# Patient Record
Sex: Female | Born: 1975 | Race: Black or African American | Hispanic: No | Marital: Single | Smoking: Never smoker
Health system: Southern US, Community
[De-identification: ages and names within clinical notes are randomized; demographics above are authoritative.]

## PROBLEM LIST (undated history)

## (undated) DIAGNOSIS — J45909 Unspecified asthma, uncomplicated: Secondary | ICD-10-CM

## (undated) DIAGNOSIS — R569 Unspecified convulsions: Secondary | ICD-10-CM

## (undated) HISTORY — PX: EYE SURGERY: SHX253

## (undated) HISTORY — PX: SKIN BIOPSY: SHX1

## (undated) HISTORY — PX: ENDOMETRIAL BIOPSY: SHX622

## (undated) HISTORY — PX: TONSILLECTOMY: SUR1361

---

## 2013-03-29 ENCOUNTER — Other Ambulatory Visit (HOSPITAL_COMMUNITY)
Admission: RE | Admit: 2013-03-29 | Discharge: 2013-03-29 | Disposition: A | Discharge status: Home / Self Care | Payer: BC Managed Care – PPO | Source: Ambulatory Visit | Attending: Obstetrics & Gynecology | Admitting: Obstetrics & Gynecology

## 2013-03-29 DIAGNOSIS — Z01419 Encounter for gynecological examination (general) (routine) without abnormal findings: Secondary | ICD-10-CM | POA: Insufficient documentation

## 2013-03-29 DIAGNOSIS — Z1151 Encounter for screening for human papillomavirus (HPV): Secondary | ICD-10-CM | POA: Insufficient documentation

## 2013-03-29 DIAGNOSIS — Z113 Encounter for screening for infections with a predominantly sexual mode of transmission: Secondary | ICD-10-CM | POA: Insufficient documentation

## 2013-04-16 ENCOUNTER — Emergency Department (HOSPITAL_COMMUNITY): Payer: BC Managed Care – PPO

## 2013-04-16 ENCOUNTER — Emergency Department (HOSPITAL_COMMUNITY)
Admission: EM | Admit: 2013-04-16 | Discharge: 2013-04-16 | Disposition: A | Discharge status: Home / Self Care | Payer: BC Managed Care – PPO | Attending: Emergency Medicine | Admitting: Emergency Medicine

## 2013-04-16 ENCOUNTER — Encounter (HOSPITAL_COMMUNITY): Payer: Self-pay | Admitting: Emergency Medicine

## 2013-04-16 DIAGNOSIS — Z8669 Personal history of other diseases of the nervous system and sense organs: Secondary | ICD-10-CM | POA: Insufficient documentation

## 2013-04-16 DIAGNOSIS — J45901 Unspecified asthma with (acute) exacerbation: Secondary | ICD-10-CM | POA: Insufficient documentation

## 2013-04-16 HISTORY — DX: Unspecified convulsions: R56.9

## 2013-04-16 HISTORY — DX: Unspecified asthma, uncomplicated: J45.909

## 2013-04-16 MED ORDER — PREDNISONE 20 MG PO TABS
60.0000 mg | ORAL_TABLET | Freq: Once | ORAL | Status: AC
  Administered 2013-04-16: 60 mg via ORAL
  Filled 2013-04-16: qty 3

## 2013-04-16 MED ORDER — IPRATROPIUM BROMIDE 0.02 % IN SOLN
0.5000 mg | Freq: Once | RESPIRATORY_TRACT | Status: AC
  Administered 2013-04-16: 0.5 mg via RESPIRATORY_TRACT
  Filled 2013-04-16: qty 2.5

## 2013-04-16 MED ORDER — ALBUTEROL SULFATE HFA 108 (90 BASE) MCG/ACT IN AERS: 2.0000 | INHALATION_SPRAY | RESPIRATORY_TRACT | Status: AC | PRN

## 2013-04-16 MED ORDER — PREDNISONE 10 MG PO TABS: 60.0000 mg | ORAL_TABLET | Freq: Every day | ORAL | Status: AC

## 2013-04-16 MED ORDER — ALBUTEROL SULFATE (5 MG/ML) 0.5% IN NEBU
5.0000 mg | INHALATION_SOLUTION | Freq: Once | RESPIRATORY_TRACT | Status: AC
  Administered 2013-04-16: 5 mg via RESPIRATORY_TRACT
  Filled 2013-04-16: qty 1

## 2013-04-16 NOTE — ED Notes (Signed)
Bed: WA11 Expected date:  Expected time:  Means of arrival:  Comments: EMS 

## 2013-04-16 NOTE — ED Provider Notes (Signed)
CSN: [id number]     Arrival date & time 04/16/13  2206 History   First MD Initiated Contact with Patient 04/16/13 2208     Chief Complaint  Patient presents with  . Shortness of Breath    HPI Patient states developing some shortness of breath today with some tightness in her upper back.  She has a history of asthma.  She states this feels similar.  She's had productive coughing congestion.  She had some shortness of breath when she is walking up her stairs this evening which is what prompted her to call EMS.  She states initially she thought this could be an allergic reaction because she was having some itching of her bilateral antecubital fossa scar that seems to resolve.  No hives noted by EMS.   Past Medical History  Diagnosis Date  . Asthma   . Seizures    Past Surgical History  Procedure Laterality Date  . Tonsillectomy    . Eye surgery    . Endometrial biopsy    . Skin biopsy     History reviewed. No pertinent family history. History  Substance Use Topics  . Smoking status: Never Smoker   . Smokeless tobacco: Never Used  . Alcohol Use: No   OB History   Grav Para Term Preterm Abortions TAB SAB Ect Mult Living                 Review of Systems  All other systems reviewed and are negative.    Allergies  Dilantin  Home Medications  No current outpatient prescriptions on file. BP 148/95  Pulse 85  Temp(Src) 98.6 F (37 C) (Oral)  Resp 20  Ht 5\' 2"  (1.575 m)  Wt 230 lb (104.327 kg)  BMI 42.06 kg/m2  SpO2 100% Physical Exam  Nursing note and vitals reviewed. Constitutional: She is oriented to person, place, and time. She appears well-developed and well-nourished. No distress.  HENT:  Head: Normocephalic and atraumatic.  Eyes: EOM are normal.  Neck: Normal range of motion.  Cardiovascular: Normal rate, regular rhythm and normal heart sounds.   Pulmonary/Chest: Effort normal. She has wheezes.  Abdominal: Soft. She exhibits no distension. There is no  tenderness.  Musculoskeletal: Normal range of motion.  Neurological: She is alert and oriented to person, place, and time.  Skin: Skin is warm and dry.  Psychiatric: She has a normal mood and affect. Judgment normal.    ED Course  Procedures (including critical care time) Labs Review Labs Reviewed - No data to display Imaging Review Dg Chest 2 View  04/16/2013   CLINICAL DATA:  Short of breath.  EXAM: CHEST  2 VIEW  COMPARISON:  None.  FINDINGS: Cardiopericardial silhouette within normal limits. Mediastinal contours normal. Trachea midline. No airspace disease or effusion.  IMPRESSION: No active cardiopulmonary disease.   Electronically Signed   By: Andreas Newport M.D.   On: 04/16/2013 23:14    EKG Interpretation   None       MDM  No diagnosis found. This appears to be asthma exacerbation/bronchospasm.  She feels much better after Atrovent and albuterol.  Prednisone given.  Chest x-ray clear.  Vital signs normal.  No indication for additional testing.  The patient is PERC negative.     Lyanne Co, MD 04/16/13 [phone]

## 2013-04-16 NOTE — ED Notes (Signed)
Brought in by EMS from home with c/o shortness of breath while at rest.  Per EMS, pt reported that she has sudden onset of shortness of breath while sitting in a couch--- pt reports that she just has had dinner, she thought it was an "allergic reaction"; pt reports taking her inhaler but with little relief; pt reports productive cough x 4 weeks now--- states,she has "greenish phlegm"; pt also c/o "shortness of breath with little exertion like walking up the stairs".

## 2014-07-01 ENCOUNTER — Other Ambulatory Visit: Payer: Self-pay

## 2014-07-01 DIAGNOSIS — N649 Disorder of breast, unspecified: Secondary | ICD-10-CM

## 2014-07-04 ENCOUNTER — Other Ambulatory Visit: Payer: Self-pay | Admitting: Nurse Practitioner

## 2014-07-04 ENCOUNTER — Other Ambulatory Visit: Payer: Self-pay

## 2014-07-04 DIAGNOSIS — N649 Disorder of breast, unspecified: Secondary | ICD-10-CM

## 2014-07-04 DIAGNOSIS — N6310 Unspecified lump in the right breast, unspecified quadrant: Secondary | ICD-10-CM

## 2014-07-10 ENCOUNTER — Ambulatory Visit
Admission: RE | Admit: 2014-07-10 | Discharge: 2014-07-10 | Disposition: A | Discharge status: Home / Self Care | Payer: BLUE CROSS/BLUE SHIELD | Source: Ambulatory Visit | Attending: Nurse Practitioner | Admitting: Nurse Practitioner

## 2014-07-10 DIAGNOSIS — N6310 Unspecified lump in the right breast, unspecified quadrant: Secondary | ICD-10-CM

## 2014-09-09 ENCOUNTER — Ambulatory Visit (HOSPITAL_COMMUNITY): Payer: BLUE CROSS/BLUE SHIELD | Admitting: Psychiatry

## 2014-10-23 ENCOUNTER — Ambulatory Visit (HOSPITAL_COMMUNITY): Payer: BLUE CROSS/BLUE SHIELD | Admitting: Psychiatry

## 2014-12-03 ENCOUNTER — Ambulatory Visit (HOSPITAL_COMMUNITY): Payer: BLUE CROSS/BLUE SHIELD | Admitting: Psychiatry

## 2015-05-28 ENCOUNTER — Other Ambulatory Visit (HOSPITAL_COMMUNITY)
Admission: RE | Admit: 2015-05-28 | Discharge: 2015-05-28 | Disposition: A | Discharge status: Home / Self Care | Payer: BLUE CROSS/BLUE SHIELD | Source: Ambulatory Visit | Attending: Obstetrics & Gynecology | Admitting: Obstetrics & Gynecology

## 2015-05-28 ENCOUNTER — Other Ambulatory Visit: Payer: Self-pay | Admitting: Obstetrics & Gynecology

## 2015-05-28 DIAGNOSIS — Z1151 Encounter for screening for human papillomavirus (HPV): Secondary | ICD-10-CM | POA: Insufficient documentation

## 2015-05-28 DIAGNOSIS — Z01419 Encounter for gynecological examination (general) (routine) without abnormal findings: Secondary | ICD-10-CM | POA: Diagnosis present

## 2015-05-29 LAB — CYTOLOGY - PAP

## 2015-06-17 ENCOUNTER — Other Ambulatory Visit: Payer: Self-pay

## 2015-06-17 DIAGNOSIS — Z1231 Encounter for screening mammogram for malignant neoplasm of breast: Secondary | ICD-10-CM

## 2015-07-16 ENCOUNTER — Ambulatory Visit: Payer: Self-pay

## 2015-07-30 ENCOUNTER — Ambulatory Visit
Admission: RE | Admit: 2015-07-30 | Discharge: 2015-07-30 | Disposition: A | Discharge status: Home / Self Care | Payer: BLUE CROSS/BLUE SHIELD | Source: Ambulatory Visit

## 2015-07-30 DIAGNOSIS — Z1231 Encounter for screening mammogram for malignant neoplasm of breast: Secondary | ICD-10-CM

## 2016-05-28 ENCOUNTER — Emergency Department (HOSPITAL_COMMUNITY): Payer: BLUE CROSS/BLUE SHIELD

## 2016-05-28 ENCOUNTER — Emergency Department (HOSPITAL_COMMUNITY)
Admission: EM | Admit: 2016-05-28 | Discharge: 2016-05-28 | Disposition: A | Discharge status: Home / Self Care | Payer: BLUE CROSS/BLUE SHIELD | Attending: Emergency Medicine | Admitting: Emergency Medicine

## 2016-05-28 ENCOUNTER — Encounter (HOSPITAL_COMMUNITY): Payer: Self-pay

## 2016-05-28 DIAGNOSIS — Z79899 Other long term (current) drug therapy: Secondary | ICD-10-CM | POA: Diagnosis not present

## 2016-05-28 DIAGNOSIS — N201 Calculus of ureter: Secondary | ICD-10-CM | POA: Insufficient documentation

## 2016-05-28 DIAGNOSIS — J45909 Unspecified asthma, uncomplicated: Secondary | ICD-10-CM | POA: Insufficient documentation

## 2016-05-28 DIAGNOSIS — R1031 Right lower quadrant pain: Secondary | ICD-10-CM | POA: Diagnosis present

## 2016-05-28 LAB — COMPREHENSIVE METABOLIC PANEL
ALT: 16 U/L (ref 14–54)
ANION GAP: 9 (ref 5–15)
AST: 20 U/L (ref 15–41)
Albumin: 4 g/dL (ref 3.5–5.0)
Alkaline Phosphatase: 67 U/L (ref 38–126)
BILIRUBIN TOTAL: 0.4 mg/dL (ref 0.3–1.2)
BUN: 8 mg/dL (ref 6–20)
CHLORIDE: 103 mmol/L (ref 101–111)
CO2: 26 mmol/L (ref 22–32)
Calcium: 9.2 mg/dL (ref 8.9–10.3)
Creatinine, Ser: 0.78 mg/dL (ref 0.44–1.00)
GFR calc Af Amer: >60 mL/min (ref >60)
GFR calc non Af Amer: >60 mL/min (ref >60)
Glucose, Bld: 126 mg/dL — ABNORMAL HIGH (ref 65–99)
POTASSIUM: 3.5 mmol/L (ref 3.5–5.1)
SODIUM: 138 mmol/L (ref 135–145)
Total Protein: 7.8 g/dL (ref 6.5–8.1)

## 2016-05-28 LAB — URINALYSIS, ROUTINE W REFLEX MICROSCOPIC
Bilirubin Urine: NEGATIVE
Glucose, UA: NEGATIVE mg/dL
KETONES UR: NEGATIVE mg/dL
LEUKOCYTES UA: NEGATIVE
Nitrite: NEGATIVE
Protein, ur: NEGATIVE mg/dL
SPECIFIC GRAVITY, URINE: 1.015 (ref 1.005–1.030)
WBC UA: NONE SEEN WBC/hpf (ref 0–5)
pH: 8 (ref 5.0–8.0)

## 2016-05-28 LAB — PREGNANCY, URINE: PREG TEST UR: NEGATIVE

## 2016-05-28 LAB — CBC WITH DIFFERENTIAL/PLATELET
Basophils Absolute: 0 10*3/uL (ref 0.0–0.1)
Basophils Relative: 0 %
Eosinophils Absolute: 0.1 10*3/uL (ref 0.0–0.7)
Eosinophils Relative: 1 %
HEMATOCRIT: 40.1 % (ref 36.0–46.0)
Hemoglobin: 13.8 g/dL (ref 12.0–15.0)
LYMPHS ABS: 2.4 10*3/uL (ref 0.7–4.0)
LYMPHS PCT: 15 %
MCH: 27.2 pg (ref 26.0–34.0)
MCHC: 34.4 g/dL (ref 30.0–36.0)
MCV: 79.1 fL (ref 78.0–100.0)
Monocytes Absolute: 0.8 10*3/uL (ref 0.1–1.0)
Monocytes Relative: 5 %
Neutro Abs: 12.3 10*3/uL — ABNORMAL HIGH (ref 1.7–7.7)
Neutrophils Relative %: 79 %
Platelets: 348 10*3/uL (ref 150–400)
RBC: 5.07 MIL/uL (ref 3.87–5.11)
RDW: 14.1 % (ref 11.5–15.5)
WBC: 15.5 10*3/uL — ABNORMAL HIGH (ref 4.0–10.5)

## 2016-05-28 LAB — LIPASE, BLOOD: Lipase: 23 U/L (ref 11–51)

## 2016-05-28 MED ORDER — OXYCODONE-ACETAMINOPHEN 5-325 MG PO TABS: 1.0000 | ORAL_TABLET | ORAL | 0 refills | Status: AC | PRN

## 2016-05-28 MED ORDER — ONDANSETRON HCL 4 MG/2ML IJ SOLN
4.0000 mg | Freq: Once | INTRAMUSCULAR | Status: AC
  Administered 2016-05-28: 4 mg via INTRAVENOUS
  Filled 2016-05-28: qty 2

## 2016-05-28 MED ORDER — FENTANYL CITRATE (PF) 100 MCG/2ML IJ SOLN
50.0000 ug | Freq: Once | INTRAMUSCULAR | Status: AC
  Administered 2016-05-28: 50 ug via INTRAVENOUS
  Filled 2016-05-28: qty 2

## 2016-05-28 MED ORDER — ONDANSETRON 4 MG PO TBDP: 4.0000 mg | ORAL_TABLET | Freq: Three times a day (TID) | ORAL | 0 refills | Status: AC | PRN

## 2016-05-28 MED ORDER — TAMSULOSIN HCL 0.4 MG PO CAPS: ORAL_CAPSULE | ORAL | 0 refills | Status: AC

## 2016-05-28 MED ORDER — KETOROLAC TROMETHAMINE 10 MG PO TABS: 10.0000 mg | ORAL_TABLET | Freq: Four times a day (QID) | ORAL | 0 refills | Status: AC | PRN

## 2016-05-28 MED ORDER — IOPAMIDOL (ISOVUE-300) INJECTION 61%
100.0000 mL | Freq: Once | INTRAVENOUS | Status: AC | PRN
  Administered 2016-05-28: 100 mL via INTRAVENOUS

## 2016-05-28 MED ORDER — KETOROLAC TROMETHAMINE 30 MG/ML IJ SOLN
30.0000 mg | Freq: Once | INTRAMUSCULAR | Status: AC
  Administered 2016-05-28: 30 mg via INTRAVENOUS
  Filled 2016-05-28: qty 1

## 2016-05-28 NOTE — ED Provider Notes (Signed)
[facility]-EMER[facility]ENCY DEPT Provider Note   CSN: [id number] Arrival date & time: 05/28/16  0046  Time seen 01:15 AM   History   Chief Complaint Chief Complaint  Patient presents with  . Abdominal Pain    HPI Megan Palmer is a 41 y.o. female.  HPI  patient reports she had felt fine all day. She states about 3 hours ago she had acute onset of right lower quadrant pain that she describes as sharp and constant. She states it hurts with movement however patient has difficulty holding still. She states nothing she does makes it feel better. She did take Aleve without improvement. She states the pain does not radiate. She has had nausea and vomited once. She denies diarrhea, constipation, dysuria, frequency, hematuria. However she states she feels the urge to urinate or have a bowel movement and she can't go. She states she's never had this pain before. She has no prior abdominal surgeries.  PCP REDMON,NOELLE, PA-C   Past Medical History:  Diagnosis Date  . Asthma   . Seizures (HCC)     There are no active problems to display for this patient.   Past Surgical History:  Procedure Laterality Date  . ENDOMETRIAL BIOPSY    . EYE SURGERY    . SKIN BIOPSY    . TONSILLECTOMY      OB History    No data available       Home Medications    Prior to Admission medications   Medication Sig Start Date End Date Taking? Authorizing Provider  albuterol (PROVENTIL HFA;VENTOLIN HFA) 108 (90 BASE) MCG/ACT inhaler Inhale 2 puffs into the lungs every 6 (six) hours as needed for wheezing or shortness of breath (SOB).   Yes Historical Provider, MD  albuterol (PROVENTIL HFA;VENTOLIN HFA) 108 (90 BASE) MCG/ACT inhaler Inhale 2 puffs into the lungs every 4 (four) hours as needed for wheezing or shortness of breath. 04/16/13  Yes Azalia Bilis, MD  busPIRone (BUSPAR) 10 MG tablet Take 10 mg by mouth 3 (three) times daily.   Yes Historical Provider, MD  cetirizine (ZYRTEC) 10 MG tablet Take 10 mg by mouth  daily.   Yes Historical Provider, MD  fluticasone (FLOVENT HFA) 220 MCG/ACT inhaler Inhale 2 puffs into the lungs 2 (two) times daily.   Yes Historical Provider, MD  Multiple Vitamins-Minerals (CENTRUM PO) Take 1 tablet by mouth daily.   Yes Historical Provider, MD  omeprazole (PRILOSEC) 20 MG capsule Take 20 mg by mouth daily.   Yes Historical Provider, MD  sertraline (ZOLOFT) 100 MG tablet Take 150 mg by mouth daily. Takes one and one half tablets to =150mg    Yes Historical Provider, MD  traZODone (DESYREL) 50 MG tablet Take 50 mg by mouth at bedtime.   Yes Historical Provider, MD  ketorolac (TORADOL) 10 MG tablet Take 1 tablet (10 mg total) by mouth every 6 (six) hours as needed. 05/28/16   Devoria Albe, MD  ondansetron (ZOFRAN ODT) 4 MG disintegrating tablet Take 1 tablet (4 mg total) by mouth every 8 (eight) hours as needed for nausea or vomiting. 05/28/16   Devoria Albe, MD  oxyCODONE-acetaminophen (PERCOCET/ROXICET) 5-325 MG tablet Take 1 tablet by mouth every 4 (four) hours as needed for severe pain. 05/28/16   Devoria Albe, MD  predniSONE (DELTASONE) 10 MG tablet Take 6 tablets (60 mg total) by mouth daily. 04/16/13   Azalia Bilis, MD  tamsulosin (FLOMAX) 0.4 MG CAPS capsule Take 1 po QD until you pass the stone. 05/28/16  Devoria AlbeIva Ethin Drummond, MD    Family History No family history on file.  Social History Social History  Substance Use Topics  . Smoking status: Never Smoker  . Smokeless tobacco: Never Used  . 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    Physical Exam Updated Vital Signs BP 154/98 (BP Location: Left Arm)   Pulse 89   Temp 98.4 F (36.9 C) (Oral)   Resp 20   Ht 5\' 1"  (1.549 m)   Wt 228 lb (103.4 kg)   SpO2 100%   BMI 43.08 kg/m   Vital signs normal except for hypertension   Physical Exam  Constitutional: She is oriented to person, place,  and time. She appears well-developed and well-nourished.  Non-toxic appearance. She does not appear ill. She appears distressed.  HENT:  Head: Normocephalic and atraumatic.  Right Ear: External ear normal.  Left Ear: External ear normal.  Nose: Nose normal. No mucosal edema or rhinorrhea.  Mouth/Throat: Oropharynx is clear and moist and mucous membranes are normal. No dental abscesses or uvula swelling.  Eyes: Conjunctivae and EOM are normal. Pupils are equal, round, and reactive to light.  Eyes mildly disconjugate (left deviate laterally)  Neck: Normal range of motion and full passive range of motion without pain. Neck supple.  Cardiovascular: Normal rate, regular rhythm and normal heart sounds.  Exam reveals no gallop and no friction rub.   No murmur heard. Pulmonary/Chest: Effort normal and breath sounds normal. No respiratory distress. She has no wheezes. She has no rhonchi. She has no rales. She exhibits no tenderness and no crepitus.  Abdominal: Soft. Normal appearance and bowel sounds are normal. She exhibits no distension. There is tenderness. There is no rebound and no guarding.    Musculoskeletal: Normal range of motion. She exhibits no edema or tenderness.  Moves all extremities well.   Neurological: She is alert and oriented to person, place, and time. She has normal strength. No cranial nerve deficit.  Skin: Skin is warm, dry and intact. No rash noted. No erythema. No pallor.  Psychiatric: She has a normal mood and affect. Her speech is normal and behavior is normal. Her mood appears not anxious.  Nursing note and vitals reviewed.    ED Treatments / Results  Labs (all labs ordered are listed, but only abnormal results are displayed) Results for orders placed or performed during the hospital encounter of 05/28/16  Comprehensive metabolic panel  Result Value Ref Range   Sodium 138 135 - 145 mmol/L   Potassium 3.5 3.5 - 5.1 mmol/L   Chloride 103 101 - 111 mmol/L   CO2 26  22 - 32 mmol/L   Glucose, Bld 126 (H) 65 - 99 mg/dL   BUN 8 6 - 20 mg/dL   Creatinine, Ser 1.610.78 0.44 - 1.00 mg/dL   Calcium 9.2 8.9 - 09.610.3 mg/dL   Total Protein 7.8 6.5 - 8.1 g/dL   Albumin 4.0 3.5 - 5.0 g/dL   AST 20 15 - 41 U/L   ALT 16 14 - 54 U/L   Alkaline Phosphatase 67 38 - 126 U/L   Total Bilirubin 0.4 0.3 - 1.2 mg/dL   GFR calc non Af Amer >60 >60 mL/min   GFR calc Af Amer >60 >60 mL/min   Anion gap 9 5 - 15  CBC with Differential  Result Value Ref  Range   WBC 15.5 (H) 4.0 - 10.5 K/uL   RBC 5.07 3.87 - 5.11 MIL/uL   Hemoglobin 13.8 12.0 - 15.0 g/dL   HCT 16.1 09.6 - 04.5 %   MCV 79.1 78.0 - 100.0 fL   MCH 27.2 26.0 - 34.0 pg   MCHC 34.4 30.0 - 36.0 g/dL   RDW 40.9 81.1 - 91.4 %   Platelets 348 150 - 400 K/uL   Neutrophils Relative % 79 %   Neutro Abs 12.3 (H) 1.7 - 7.7 K/uL   Lymphocytes Relative 15 %   Lymphs Abs 2.4 0.7 - 4.0 K/uL   Monocytes Relative 5 %   Monocytes Absolute 0.8 0.1 - 1.0 K/uL   Eosinophils Relative 1 %   Eosinophils Absolute 0.1 0.0 - 0.7 K/uL   Basophils Relative 0 %   Basophils Absolute 0.0 0.0 - 0.1 K/uL  Lipase, blood  Result Value Ref Range   Lipase 23 11 - 51 U/L  Urinalysis, Routine w reflex microscopic  Result Value Ref Range   Color, Urine YELLOW YELLOW   APPearance CLEAR CLEAR   Specific Gravity, Urine 1.015 1.005 - 1.030   pH 8.0 5.0 - 8.0   Glucose, UA NEGATIVE NEGATIVE mg/dL   Hgb urine dipstick SMALL (A) NEGATIVE   Bilirubin Urine NEGATIVE NEGATIVE   Ketones, ur NEGATIVE NEGATIVE mg/dL   Protein, ur NEGATIVE NEGATIVE mg/dL   Nitrite NEGATIVE NEGATIVE   Leukocytes, UA NEGATIVE NEGATIVE   RBC / HPF 0-5 0 - 5 RBC/hpf   WBC, UA NONE SEEN 0 - 5 WBC/hpf   Bacteria, UA RARE (A) NONE SEEN   Mucous PRESENT   Pregnancy, urine  Result Value Ref Range   Preg Test, Ur NEGATIVE NEGATIVE   Laboratory interpretation all normal except leukocytosis, microscopic hematuria    EKG  EKG Interpretation None       Radiology Ct  Abdomen Pelvis W Contrast  Result Date: 05/28/2016 CLINICAL DATA:  Right lower quadrant abdominal pressure for 2 days with vomiting. EXAM: CT ABDOMEN AND PELVIS WITH CONTRAST TECHNIQUE: Multidetector CT imaging of the abdomen and pelvis was performed using the standard protocol following bolus administration of intravenous contrast. CONTRAST:  ISOVUE-300 IOPAMIDOL (ISOVUE-300) INJECTION 61% COMPARISON:  Unenhanced CT obtained earlier today. FINDINGS: Lower chest: No acute abnormality. Hepatobiliary: No focal liver abnormality is seen. No gallstones, gallbladder wall thickening, or biliary dilatation. Pancreas: Unremarkable. No pancreatic ductal dilatation or surrounding inflammatory changes. Spleen: Normal in size without focal abnormality. Adrenals/Urinary Tract: There is marked hydronephrosis and ureteral dilatation on the right, with perinephric and periureteral stranding as well as perinephric effusion. There is an obstructing 3 mm calculus in the distal right ureter within 1 cm of the ureterovesical junction. Delayed function of the right kidney. The left kidney is normal. Collecting system and ureter are normal on the left. Urinary bladder is unremarkable. Stomach/Bowel: Stomach, small bowel and colon are unremarkable. Vascular/Lymphatic: No significant vascular findings are present. No enlarged abdominal or pelvic lymph nodes. Reproductive: Markedly enlarged multi fibroid uterus. Largest fibroid is in the fundus, measuring over 12 cm. Other: No ascites. Musculoskeletal: No significant skeletal lesion. IMPRESSION: 1. Obstructing 3 mm calculus of the distal right ureter, 1 cm from the UVJ. Marked hydronephrosis and hydroureter. Perinephric fluid on the right. 2. Markedly enlarged multi fibroid uterus. Largest individual fibroid is over 12 cm. Electronically Signed   By: Ellery Plunk M.D.   On: 05/28/2016 03:48   Ct Renal Stone Study  Result Date:  05/28/2016 CLINICAL DATA:  RIGHT lower quadrant  pressure for 2 days, vomiting. Pelvic pressure. EXAM: CT ABDOMEN AND PELVIS WITHOUT CONTRAST TECHNIQUE: Multidetector CT imaging of the abdomen and pelvis was performed following the standard protocol without IV contrast. COMPARISON:  None. FINDINGS: LOWER CHEST: Lung bases are clear. The visualized heart size is normal. No pericardial effusion. HEPATOBILIARY: Normal. PANCREAS: Normal. SPLEEN: Normal. ADRENALS/URINARY TRACT: Kidneys are orthotopic RIGHT kidney appears mildly enlarged, diffusely hypodense/edematous with perinephric effusion. Mild RIGHT hydronephrosis. Limited assessment for renal masses on this nonenhanced examination. The unopacified ureters are normal in course and caliber. Urinary bladder is decompressed and unremarkable. Normal adrenal glands. STOMACH/BOWEL: The stomach, small and large bowel are normal in course and caliber without inflammatory changes, sensitivity decreased by lack of enteric contrast. The appendix is not discretely identified, however there are no inflammatory changes in the right lower quadrant. VASCULAR/LYMPHATIC: Aortoiliac vessels are normal in course and caliber. No lymphadenopathy by CT size criteria. REPRODUCTIVE: Enlarged uterus with multiple leiomyomata, largest in the uterine fundus measuring 12.6 x 10.7 x 11.2 cm. OTHER: No intraperitoneal free fluid or free air. MUSCULOSKELETAL: Non-acute. IMPRESSION: Markedly edematous RIGHT kidney with effusion, differential diagnosis is pyelonephritis, vascular compromise less likely (limited characterization without contrast). Mild RIGHT hydro nephrosis without urolithiasis. Enlarged leiomyomatosis uterus. Electronically Signed   By: Awilda Metro M.D.   On: 05/28/2016 02:37    Procedures Procedures (including critical care time)  Medications Ordered in ED Medications  fentaNYL (SUBLIMAZE) injection 50 mcg (50 mcg Intravenous Given 05/28/16 0154)  ondansetron (ZOFRAN) injection 4 mg (4 mg Intravenous Given 05/28/16  0154)  iopamidol (ISOVUE-300) 61 % injection 100 mL (100 mLs Intravenous Contrast Given 05/28/16 0325)  ketorolac (TORADOL) 30 MG/ML injection 30 mg (30 mg Intravenous Given 05/28/16 0402)     Initial Impression / Assessment and Plan / ED Course  I have reviewed the triage vital signs and the nursing notes.  Pertinent labs & imaging results that were available during my care of the patient were reviewed by me and considered in my medical decision making (see chart for details).    Patient was given IV pain and nausea medication. CT renal was done for suspected ureteral stone.  Recheck at 2:45 AM we discussed her test results. Patient is aware of her fibroids. Her bladder scan only showed 41 mL urine. We discussed her CT results and she is agreeable to proceeding with the IV contrast to look for vascular compromise of her right kidney. 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 She has refused more pain medication at this time, advised to let nurse know if she changes her mind.   03:55 AM we discussed her contrasted CT results. She was given IV toradol for pain.   At time of discharge her pain was improved. She wants to be referred to Urology in [place]. She was given a strainer to use.   Final Clinical Impressions(s) / ED Diagnoses   Final diagnoses:  Ureteral calculus, right    New Prescriptions New Prescriptions   KETOROLAC (TORADOL) 10 MG TABLET    Take 1 tablet (10 mg total) by mouth every 6 (six) hours as needed.   ONDANSETRON (ZOFRAN ODT) 4 MG DISINTEGRATING TABLET    Take 1 tablet (4 mg total) by mouth every 8 (eight) hours as needed for nausea or vomiting.   OXYCODONE-ACETAMINOPHEN (PERCOCET/ROXICET) 5-325 MG TABLET  Take 1 tablet by mouth every 4 (four) hours as needed for severe pain.   TAMSULOSIN (FLOMAX) 0.4 MG CAPS CAPSULE    Take 1 po QD until you pass the stone.    Plan discharge  Devoria Albe, MD, Concha Pyo, MD 05/28/16 [phone]

## 2016-05-28 NOTE — ED Notes (Signed)
Patient transported to CT 

## 2016-05-28 NOTE — Discharge Instructions (Signed)
Drink plenty of fluids. Take the medications as prescribed. Strain your urine and save the stone when passed, it can be sent off for analysis to see what type of stone you have. Follow up with Dr Mena GoesEskridge, Urology. You have a 3 mm stone in the right UVJ. Return to the ED for fever, uncontrolled vomiting or pain.

## 2016-05-28 NOTE — ED Triage Notes (Signed)
RLQ abd pressure x 2 days with vomiting, no diarrhea.  Pt states she also feels like she has to urinate and also feels like she has to have a bm but nothing happens.

## 2016-07-27 ENCOUNTER — Other Ambulatory Visit: Payer: Self-pay | Admitting: Physician Assistant

## 2016-07-27 DIAGNOSIS — Z1231 Encounter for screening mammogram for malignant neoplasm of breast: Secondary | ICD-10-CM

## 2016-08-19 ENCOUNTER — Ambulatory Visit
Admission: RE | Admit: 2016-08-19 | Discharge: 2016-08-19 | Disposition: A | Discharge status: Home / Self Care | Payer: BLUE CROSS/BLUE SHIELD | Source: Ambulatory Visit | Attending: Physician Assistant | Admitting: Physician Assistant

## 2016-08-19 DIAGNOSIS — Z1231 Encounter for screening mammogram for malignant neoplasm of breast: Secondary | ICD-10-CM

## 2017-08-05 ENCOUNTER — Other Ambulatory Visit: Payer: Self-pay | Admitting: Physician Assistant

## 2017-08-05 DIAGNOSIS — Z139 Encounter for screening, unspecified: Secondary | ICD-10-CM

## 2017-09-05 ENCOUNTER — Ambulatory Visit
Admission: RE | Admit: 2017-09-05 | Discharge: 2017-09-05 | Disposition: A | Discharge status: Home / Self Care | Payer: BLUE CROSS/BLUE SHIELD | Source: Ambulatory Visit | Attending: Physician Assistant | Admitting: Physician Assistant

## 2017-09-05 DIAGNOSIS — Z139 Encounter for screening, unspecified: Secondary | ICD-10-CM

## 2018-07-25 ENCOUNTER — Encounter: Payer: Self-pay | Admitting: Registered"

## 2018-07-25 ENCOUNTER — Encounter: Payer: 59 | Attending: Physician Assistant | Admitting: Registered"

## 2018-07-25 ENCOUNTER — Other Ambulatory Visit: Payer: Self-pay

## 2018-07-25 DIAGNOSIS — E669 Obesity, unspecified: Secondary | ICD-10-CM

## 2018-07-25 NOTE — Progress Notes (Signed)
  Medical Nutrition Therapy:  Appt start time: 3:55 end time:  4:55.   Assessment:  Primary concerns today: Pt arrives stating she wants to learn how to read read labels.   Pt expectations: wants to learn how to fight the temptations of eating out, reading labels and how to understand it  Pt states she has a 44 yr old daughter and wants to model for her how to eat. Pt states she eats half a meal when eating out and saves other half for another time. Pt states she likes to have ice cream before going to bed and will eat that as dinner. Pt states she wants to know how much nuts she can have in a day due to fat content.   Pt states she typically does not eat dinner with daughter, but will clean and organize in preparation for the next day. Pt states she cooks on the weekend. Will prepare enough entree for the week and change out the sides throughout the week. Will have one day in the middle of the week for eating out.   Preferred Learning Style:   No preference indicated   Learning Readiness:   Ready  Change in progress   MEDICATIONS: See list   DIETARY INTAKE:  Usual eating pattern includes 2 meals and 0-2 snacks per day.  Everyday foods include ice cream, fast food, oatmeal.  Avoided foods include none stated.    24-hr recall:  B ( AM): low-sugar oatmeal + handful mixed nuts + mandarin orange Snk ( AM): none  L ( PM): Mexican-3 chicken taquitos + salad + guacamole Snk ( PM): sometimes granola bar D ( PM): 1/3 c ice cream  Snk ( PM): sometimes snack bag of miniature oreo's in the middle of the night Beverages: water, coffee, soda (once a week)  Usual physical activity: none stated   Estimated energy needs: 1800 calories 200 g carbohydrates 135 g protein 50 g fat  Progress Towards Goal(s):  In progress.   Nutritional Diagnosis:  NB-1.1 Food and nutrition-related knowledge deficit As related to reading nutrition facts label.  As evidenced by pt verbalizes incomplete  knowledge.    Intervention:  Nutrition education and counseling. Pt was educated and counseled on nourishing body during the day, importance of having balanced meals, importance of having meals together as family daily, and the reading nutrition facts label. Pt was in agreement with goals listed.  Goals: - Aim to have dinner with daughter nightly.  - Have meals include: protein, non-starchy vegetables, carbohydrates.  - Snacks between meals include carbohydrates + protein such as fruit + nuts/nut butter - Increase fiber intake with whole grains, fruits/vegetables, beans, and nuts.   Teaching Method Utilized:  Visual Auditory Hands on  Handouts given during visit include:  What's on the Nutrition Facts Label?  Barriers to learning/adherence to lifestyle change: none identified  Demonstrated degree of understanding via:  Teach Back   Monitoring/Evaluation:  Dietary intake, exercise, and body weight in 1 month(s).

## 2018-07-25 NOTE — Patient Instructions (Addendum)
-   Aim to have dinner with daughter nightly.   - Have meals include: protein, non-starchy vegetables, carbohydrates.   - Snacks between meals include carbohydrates + protein such as fruit + nuts/nut butter  - Increase fiber intake with whole grains, fruits/vegetables, beans, and nuts.

## 2018-08-29 ENCOUNTER — Ambulatory Visit: Payer: Self-pay | Admitting: Registered"

## 2018-09-04 ENCOUNTER — Other Ambulatory Visit: Payer: Self-pay | Admitting: Obstetrics & Gynecology

## 2018-09-04 DIAGNOSIS — Z1231 Encounter for screening mammogram for malignant neoplasm of breast: Secondary | ICD-10-CM

## 2018-09-28 ENCOUNTER — Ambulatory Visit: Payer: 59

## 2018-09-28 ENCOUNTER — Other Ambulatory Visit: Payer: Self-pay

## 2018-09-28 ENCOUNTER — Ambulatory Visit
Admission: RE | Admit: 2018-09-28 | Discharge: 2018-09-28 | Disposition: A | Discharge status: Home / Self Care | Payer: 59 | Source: Ambulatory Visit | Attending: Obstetrics & Gynecology | Admitting: Obstetrics & Gynecology

## 2018-09-28 DIAGNOSIS — Z1231 Encounter for screening mammogram for malignant neoplasm of breast: Secondary | ICD-10-CM

## 2019-09-11 ENCOUNTER — Other Ambulatory Visit: Payer: Self-pay | Admitting: Obstetrics & Gynecology

## 2019-09-11 DIAGNOSIS — Z1231 Encounter for screening mammogram for malignant neoplasm of breast: Secondary | ICD-10-CM

## 2019-10-03 ENCOUNTER — Other Ambulatory Visit: Payer: Self-pay

## 2019-10-03 ENCOUNTER — Ambulatory Visit
Admission: RE | Admit: 2019-10-03 | Discharge: 2019-10-03 | Disposition: A | Discharge status: Home / Self Care | Payer: 59 | Source: Ambulatory Visit | Attending: Obstetrics & Gynecology | Admitting: Obstetrics & Gynecology

## 2019-10-03 DIAGNOSIS — Z1231 Encounter for screening mammogram for malignant neoplasm of breast: Secondary | ICD-10-CM

## 2020-05-13 ENCOUNTER — Ambulatory Visit (INDEPENDENT_AMBULATORY_CARE_PROVIDER_SITE_OTHER): Payer: 59

## 2020-05-13 ENCOUNTER — Other Ambulatory Visit: Payer: Self-pay

## 2020-05-13 ENCOUNTER — Ambulatory Visit: Payer: 59 | Admitting: Podiatry

## 2020-05-13 DIAGNOSIS — M722 Plantar fascial fibromatosis: Secondary | ICD-10-CM

## 2020-05-13 DIAGNOSIS — J45909 Unspecified asthma, uncomplicated: Secondary | ICD-10-CM | POA: Insufficient documentation

## 2020-05-13 DIAGNOSIS — J309 Allergic rhinitis, unspecified: Secondary | ICD-10-CM | POA: Insufficient documentation

## 2020-05-13 DIAGNOSIS — K219 Gastro-esophageal reflux disease without esophagitis: Secondary | ICD-10-CM | POA: Insufficient documentation

## 2020-05-13 DIAGNOSIS — F339 Major depressive disorder, recurrent, unspecified: Secondary | ICD-10-CM | POA: Insufficient documentation

## 2020-05-13 DIAGNOSIS — E559 Vitamin D deficiency, unspecified: Secondary | ICD-10-CM | POA: Insufficient documentation

## 2020-05-13 DIAGNOSIS — M216X9 Other acquired deformities of unspecified foot: Secondary | ICD-10-CM | POA: Diagnosis not present

## 2020-05-13 DIAGNOSIS — F419 Anxiety disorder, unspecified: Secondary | ICD-10-CM | POA: Insufficient documentation

## 2020-05-13 DIAGNOSIS — M9261 Juvenile osteochondrosis of tarsus, right ankle: Secondary | ICD-10-CM | POA: Diagnosis not present

## 2020-05-13 MED ORDER — BETAMETHASONE SOD PHOS & ACET 6 (3-3) MG/ML IJ SUSP
6.0000 mg | Freq: Once | INTRAMUSCULAR | Status: AC
  Administered 2020-05-13: 6 mg

## 2020-05-13 NOTE — Progress Notes (Signed)
  Subjective:  Patient ID: Megan Palmer, female    DOB: [birth year],  MRN: [id number]  Chief Complaint  Patient presents with  . Plantar Fasciitis    Pt states right foot history of plantar fasciitis and is currently experiencing plantar heel pain since Oct 2021.    45 y.o. female presents with the above complaint. History confirmed with patient.   Objective:  Physical Exam: warm, good capillary refill, no trophic changes or ulcerative lesions, normal DP and PT pulses and normal sensory exam. Right Foot: tenderness to palpation medial calcaneal tuber, no pain with calcaneal squeeze, decreased ankle joint ROM and +Silverskiold test normal exam, no swelling, tenderness, instability; ligaments intact, full range of motion of all ankle/foot joints other than findings noted above.  Radiographs: X-ray of the right foot: no evidence of calcaneal stress fracture, plantar calcaneal spur, posterior calcaneal spur and Haglund deformity noted  Assessment:   1. Plantar fasciitis   2. Haglund's deformity, right   3. Equinus deformity of foot      Plan:  Patient was evaluated and treated and all questions answered.  Plantar Fasciitis -XR reviewed with patient -Educated patient on stretching and icing of the affected limb -Night splint dispensed -Injection delivered to the plantar fascia of the right foot.  Procedure: Injection Tendon/Ligament Consent: Verbal consent obtained. Location: Right plantar fascia at the glabrous junction; medial approach. Skin Prep: Alcohol. Injectate: 1 cc 0.5% marcaine plain, 1 cc celestone Disposition: Patient tolerated procedure well. Injection site dressed with a band-aid.    No follow-ups on file.

## 2020-05-13 NOTE — Patient Instructions (Signed)

## 2020-06-24 ENCOUNTER — Ambulatory Visit: Payer: 59 | Admitting: Podiatry

## 2020-06-24 ENCOUNTER — Other Ambulatory Visit: Payer: Self-pay

## 2020-06-24 DIAGNOSIS — M722 Plantar fascial fibromatosis: Secondary | ICD-10-CM | POA: Diagnosis not present

## 2020-06-24 DIAGNOSIS — M9261 Juvenile osteochondrosis of tarsus, right ankle: Secondary | ICD-10-CM

## 2020-06-24 NOTE — Progress Notes (Signed)
  Subjective:  Patient ID: Megan Palmer, female    DOB: [birth year],  MRN: [id number]  Chief Complaint  Patient presents with  . Plantar Fasciitis    F/U Rt Pf -pt states," it's better, much less pain; 2/10 -worse with prolong standing/walking Tx: stretching, icing and NS    45 y.o. female presents with the above complaint. History confirmed with patient.   Objective:  Physical Exam: warm, good capillary refill, no trophic changes or ulcerative lesions, normal DP and PT pulses and normal sensory exam. Right Foot: No pain at the medial calcaneal tuber no pain with calcaneal squeeze, decreased ankle joint ROM and +Silverskiold test normal exam, no swelling, tenderness, instability; ligaments intact, full range of motion of all ankle/foot joints other than findings noted above.   Assessment:   1. Plantar fasciitis   2. Haglund's deformity, right      Plan:  Patient was evaluated and treated and all questions answered.  Plantar Fasciitis -Defer further injection today.  Patient does feel that she is improving we did discuss continued stretching and her to follow-up should she have any worsening symptoms.  Patient verbalized understanding and agreement.  No follow-ups on file.

## 2020-09-09 ENCOUNTER — Other Ambulatory Visit: Payer: Self-pay | Admitting: Obstetrics and Gynecology

## 2020-09-09 DIAGNOSIS — Z1231 Encounter for screening mammogram for malignant neoplasm of breast: Secondary | ICD-10-CM

## 2020-11-06 ENCOUNTER — Ambulatory Visit
Admission: RE | Admit: 2020-11-06 | Discharge: 2020-11-06 | Disposition: A | Discharge status: Home / Self Care | Payer: 59 | Source: Ambulatory Visit | Attending: Obstetrics and Gynecology | Admitting: Obstetrics and Gynecology

## 2020-11-06 ENCOUNTER — Other Ambulatory Visit: Payer: Self-pay

## 2020-11-06 DIAGNOSIS — Z1231 Encounter for screening mammogram for malignant neoplasm of breast: Secondary | ICD-10-CM

## 2021-12-08 ENCOUNTER — Other Ambulatory Visit: Payer: Self-pay | Admitting: Obstetrics and Gynecology

## 2021-12-08 DIAGNOSIS — Z1231 Encounter for screening mammogram for malignant neoplasm of breast: Secondary | ICD-10-CM

## 2022-01-01 ENCOUNTER — Ambulatory Visit
Admission: RE | Admit: 2022-01-01 | Discharge: 2022-01-01 | Disposition: A | Discharge status: Home / Self Care | Payer: 59 | Source: Ambulatory Visit | Attending: Obstetrics and Gynecology | Admitting: Obstetrics and Gynecology

## 2022-01-01 DIAGNOSIS — Z1231 Encounter for screening mammogram for malignant neoplasm of breast: Secondary | ICD-10-CM

## 2022-01-27 IMAGING — MG MM DIGITAL SCREENING BILAT W/ TOMO AND CAD
8 series · 8 of 24 positions shown · non-contrast
Comparison: Previous exam(s).

ACR Breast Density Category a: The breast tissue is almost entirely
fatty.

CLINICAL DATA: Screening.

EXAM:
DIGITAL SCREENING BILATERAL MAMMOGRAM WITH TOMOSYNTHESIS AND CAD
TECHNIQUE: Bilateral screening digital craniocaudal and mediolateral oblique
mammograms were obtained. Bilateral screening digital breast
tomosynthesis was performed. The images were evaluated with
computer-aided detection.

[L CC synth-2D]
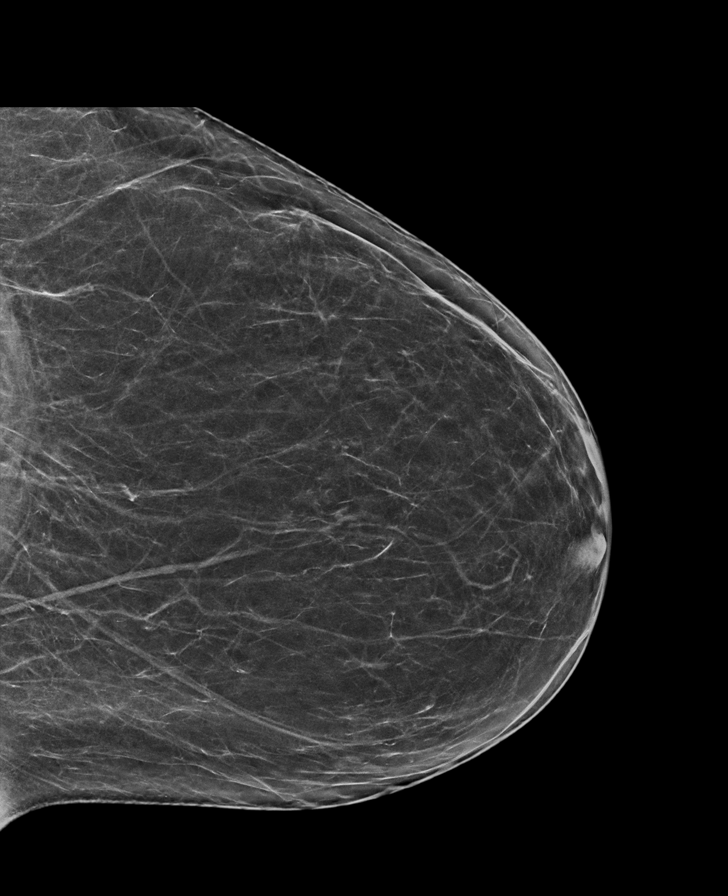

[R CC synth-2D]
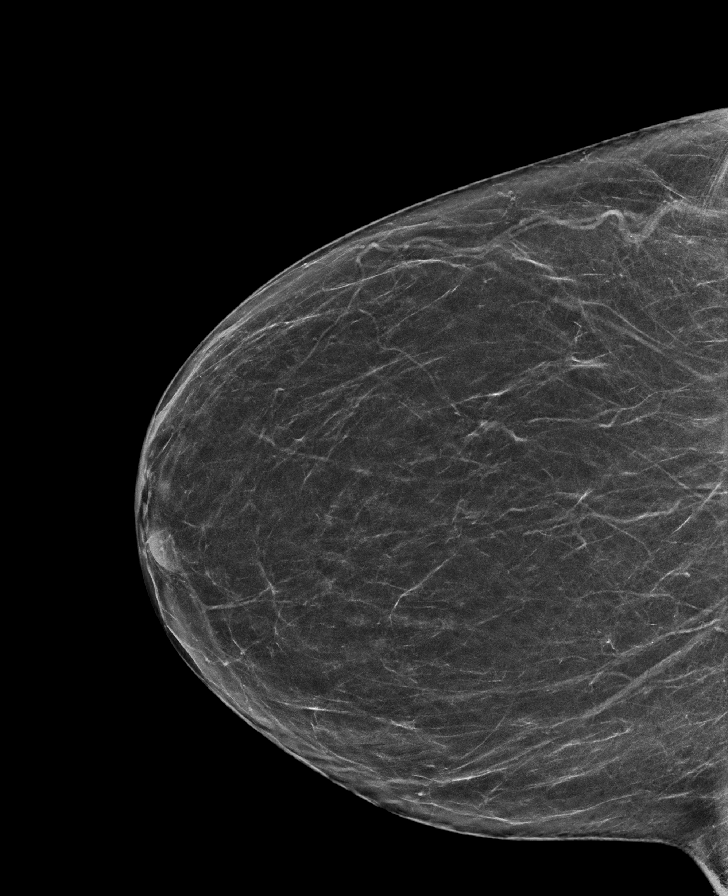

[L MLO synth-2D]
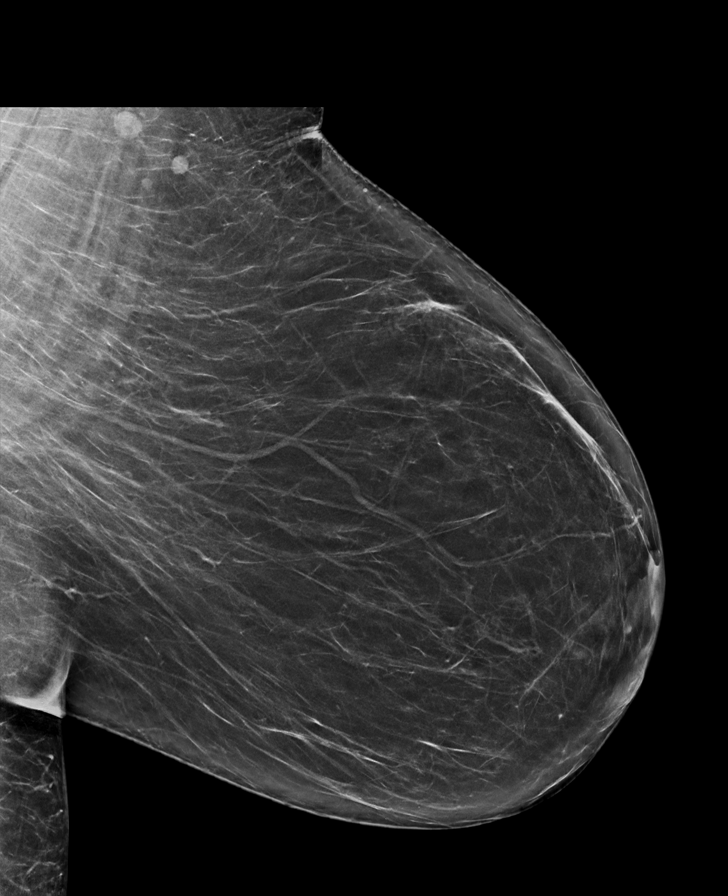

[R MLO synth-2D]
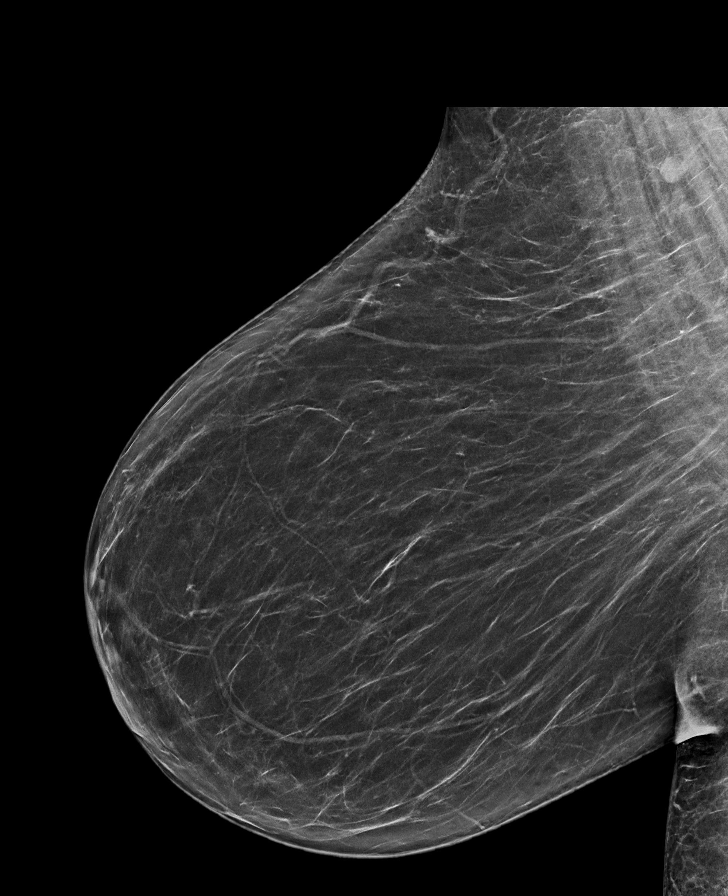

[R MLO tomo · tomo slice 35/69.0]
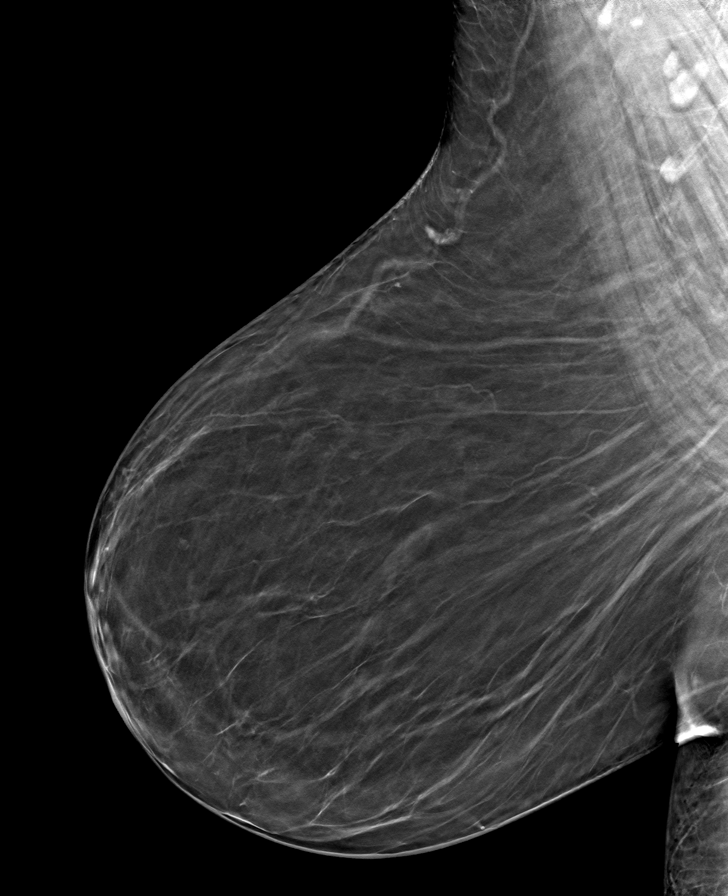

[L CC tomo · tomo slice 33/65.0]
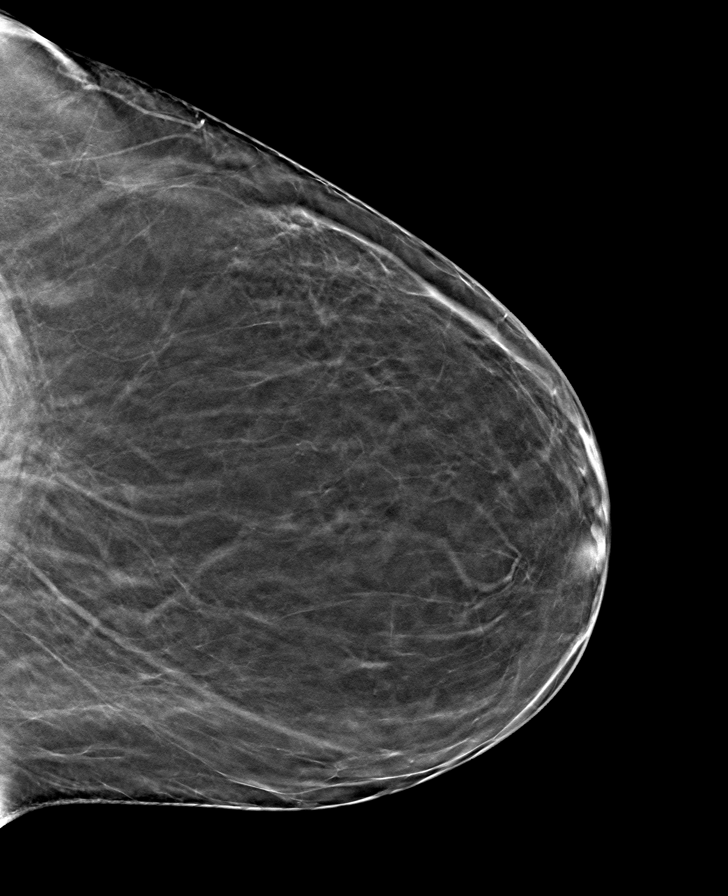

[R CC tomo · tomo slice 32/63.0]
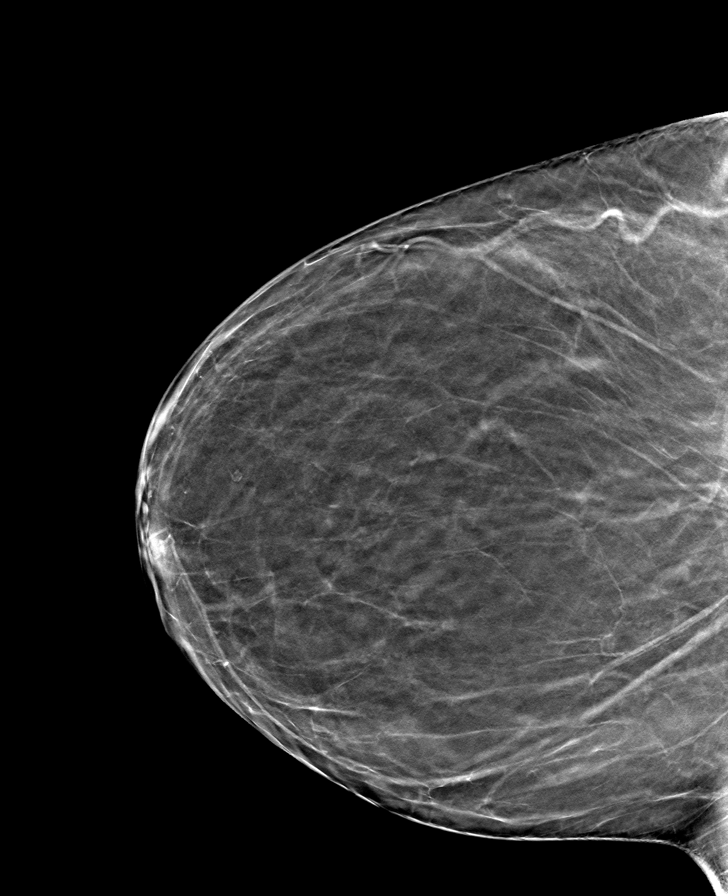

[L MLO tomo · tomo slice 37/73.0]
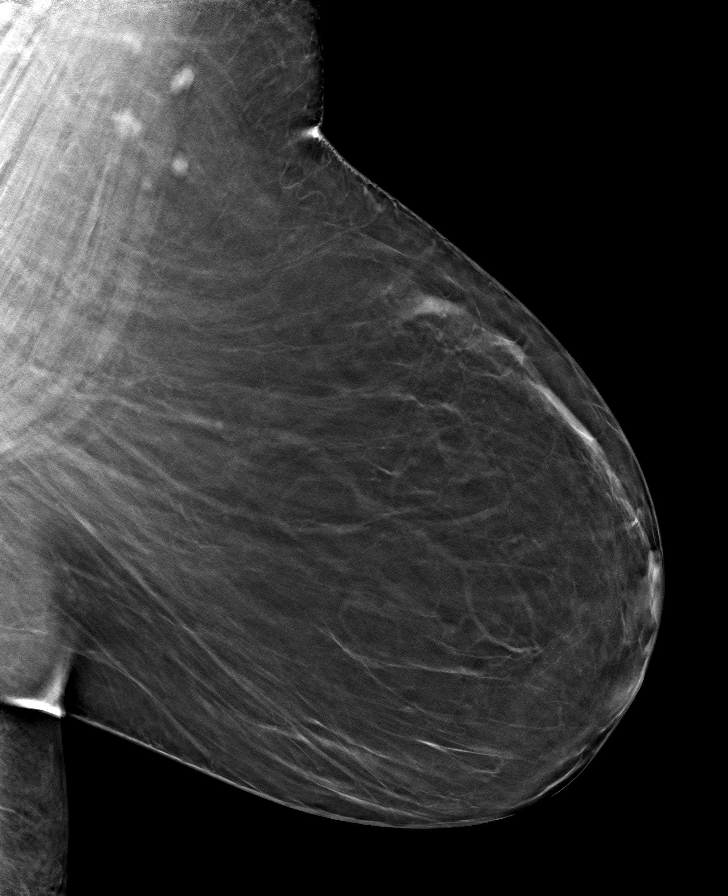

[8 of 24 positions shown; findings below may reference images not displayed]

FINDINGS: There are no findings suspicious for malignancy.
IMPRESSION: No mammographic evidence of malignancy. A result letter of this
screening mammogram will be mailed directly to the patient.

RECOMMENDATION:
Screening mammogram in one year. (Code:0E-[id number])

BI-RADS CATEGORY  1: Negative.

## 2022-12-31 ENCOUNTER — Other Ambulatory Visit: Payer: Self-pay | Admitting: Obstetrics and Gynecology

## 2022-12-31 DIAGNOSIS — Z1231 Encounter for screening mammogram for malignant neoplasm of breast: Secondary | ICD-10-CM

## 2023-01-27 ENCOUNTER — Ambulatory Visit
Admission: RE | Admit: 2023-01-27 | Discharge: 2023-01-27 | Disposition: A | Discharge status: Home / Self Care | Payer: BC Managed Care – PPO | Source: Ambulatory Visit | Attending: Obstetrics and Gynecology | Admitting: Obstetrics and Gynecology

## 2023-01-27 DIAGNOSIS — Z1231 Encounter for screening mammogram for malignant neoplasm of breast: Secondary | ICD-10-CM

## 2023-07-29 ENCOUNTER — Ambulatory Visit (INDEPENDENT_AMBULATORY_CARE_PROVIDER_SITE_OTHER)

## 2023-07-29 ENCOUNTER — Ambulatory Visit: Admitting: Podiatry

## 2023-07-29 ENCOUNTER — Encounter: Payer: Self-pay | Admitting: Podiatry

## 2023-07-29 DIAGNOSIS — M25572 Pain in left ankle and joints of left foot: Secondary | ICD-10-CM

## 2023-07-29 DIAGNOSIS — M79672 Pain in left foot: Secondary | ICD-10-CM

## 2023-07-29 DIAGNOSIS — M25472 Effusion, left ankle: Secondary | ICD-10-CM | POA: Diagnosis not present

## 2023-07-29 DIAGNOSIS — M7672 Peroneal tendinitis, left leg: Secondary | ICD-10-CM | POA: Diagnosis not present

## 2023-07-29 DIAGNOSIS — M778 Other enthesopathies, not elsewhere classified: Secondary | ICD-10-CM | POA: Diagnosis not present

## 2023-07-29 MED ORDER — METHYLPREDNISOLONE 4 MG PO TBPK: ORAL_TABLET | ORAL | 0 refills | Status: AC

## 2023-07-29 NOTE — Patient Instructions (Signed)
 For instructions on how to put on your Tri-Lock Ankle Brace, please visit [email or web address]  If was nice to meet you today. If you have any questions or any further concerns, please feel fee to give me a call. You can call our office at [phone] or please feel fee to send me a message through MyChart.

## 2023-07-30 NOTE — Progress Notes (Signed)
  Subjective:  Patient ID: Megan Palmer, female    DOB: 1975/12/22,  MRN: [id number]  Chief Complaint  Patient presents with   Ankle Pain    Patient states a year ago she got out her truck and sprain her ankle and it became swollen. Patient put ice on it and took aleve for pain. The swollen ess came back after jumping off swing and patient just wants to make sure that she did not brake any bones.     Discussed the use of AI scribe software for clinical note transcription with the patient, who gave verbal consent to proceed.  History of Present Illness The patient presents with a year-long history of right ankle pain and swelling. The symptoms began after a fall from her truck, during which she twisted her ankle. She self-treated with ice, wraps, and exercises, which led to some improvement unless she was on her feet all day. The swelling would occasionally return, especially after working in her uneven yard.  Her symptoms improved.  Recently, the swelling has worsened and spread to the foot after an incident where she jumped off a swing. She denies any specific injury associated with this recent exacerbation.      Objective:    Physical Exam General: AAO x3, NAD  Dermatological: Skin is warm, dry and supple bilateral.  There are no open sores, no preulcerative lesions, no rash or signs of infection present.  Vascular: Dorsalis Pedis artery and Posterior Tibial artery pedal pulses are 2/4 bilateral with immedate capillary fill time.  There is no pain with calf compression, swelling, warmth, erythema.   Neruologic: Grossly intact via light touch bilateral.   Musculoskeletal: Tenderness is localized to the lateral aspect of ankle.  There is localized edema to the lateral aspect of the foot, ankle not subtalar joint/sinus tarsi as well as the anterolateral ankle joint.  She gets tenderness on anterior lateral ankle joint, subtalar sinus tarsi area.  She has tenderness along course  of peroneal tendon.  Clinically the tendons appear to be intact.  No significant pain medially.  No pain the Achilles tendon.  Gait: Unassisted, Nonantalgic.     No images are attached to the encounter.    Results RADIOLOGY Ankle X-ray: Swelling on the lateral aspect of the ankle, no fractures, normal ankle joint, heel spur present (07/29/2023) Foot X-ray: Normal ankle joint, no fractures, normal metatarsals, heel spur present. Bunion noted. (07/29/2023)   Assessment:   1. Peroneal tendonitis of left lower extremity   2. Capsulitis of left foot      Plan:  Patient was evaluated and treated and all questions answered.  Assessment and Plan Assessment & Plan Peroneal tendonitis with subtalar joint inflammation Chronic right ankle swelling and pain due to peroneal tendon and subtalar joint inflammation, exacerbated by uneven surfaces and activity. X-rays negative for fractures. - Prescribed short course of oral prednisone to reduce inflammation. Medrol dose pack.  We discussed steroid injection. - Advised Aleve post-steroid course. - Instructed daily icing of the ankle. - Provided ankle brace for immobilization during activities.  Try ankle brace was dispensed help facilitate soft tissue healing. - Scheduled follow-up in one month. - Consider MRI if symptoms persist after one month. - Provided compression sock for support as swelling decreases.  Compression anklet dispensed.   Return in about 4 weeks (around 08/26/2023), or if symptoms worsen or fail to improve, for ankle pain, swelling.   Vivi Barrack DPM

## 2023-08-22 ENCOUNTER — Ambulatory Visit: Admitting: Podiatry

## 2023-08-24 ENCOUNTER — Encounter: Payer: Self-pay | Admitting: Podiatry

## 2023-08-29 ENCOUNTER — Other Ambulatory Visit: Payer: Self-pay | Admitting: Podiatry

## 2023-08-29 DIAGNOSIS — M7672 Peroneal tendinitis, left leg: Secondary | ICD-10-CM

## 2023-08-30 ENCOUNTER — Ambulatory Visit: Admitting: Podiatry

## 2023-08-31 ENCOUNTER — Encounter: Payer: Self-pay | Admitting: Podiatry

## 2023-09-10 ENCOUNTER — Other Ambulatory Visit

## 2023-09-26 ENCOUNTER — Ambulatory Visit
Admission: RE | Admit: 2023-09-26 | Discharge: 2023-09-26 | Disposition: A | Discharge status: Home / Self Care | Source: Ambulatory Visit | Attending: Podiatry | Admitting: Podiatry

## 2023-09-26 DIAGNOSIS — M7672 Peroneal tendinitis, left leg: Secondary | ICD-10-CM

## 2023-09-29 ENCOUNTER — Encounter: Payer: Self-pay | Admitting: Podiatry

## 2023-09-29 NOTE — Telephone Encounter (Signed)
 Can you call to see if we can get the report? Thanks!

## 2023-10-03 ENCOUNTER — Ambulatory Visit: Payer: Self-pay | Admitting: Podiatry

## 2023-10-04 ENCOUNTER — Other Ambulatory Visit: Payer: Self-pay | Admitting: Podiatry

## 2023-10-04 DIAGNOSIS — M25372 Other instability, left ankle: Secondary | ICD-10-CM

## 2023-10-06 ENCOUNTER — Encounter: Payer: Self-pay | Admitting: Obstetrics and Gynecology

## 2023-10-07 ENCOUNTER — Other Ambulatory Visit: Payer: Self-pay | Admitting: Obstetrics and Gynecology

## 2023-10-07 DIAGNOSIS — D219 Benign neoplasm of connective and other soft tissue, unspecified: Secondary | ICD-10-CM

## 2023-10-10 ENCOUNTER — Other Ambulatory Visit: Payer: Self-pay | Admitting: Obstetrics and Gynecology

## 2023-10-10 DIAGNOSIS — Z1231 Encounter for screening mammogram for malignant neoplasm of breast: Secondary | ICD-10-CM

## 2023-10-16 NOTE — Therapy (Unsigned)
 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 DOB:03-21-1976, 48 y.o., female Today's Date: 10/17/2023  END OF SESSION:  PT End of Session - 10/17/23 0844     Visit Number 1    Number of Visits 4    Date for PT Re-Evaluation 12/17/23    Authorization Type BCBS    PT Start Time 0745    PT Stop Time 0830    PT Time Calculation (min) 45 min    Activity Tolerance Patient tolerated treatment well    Behavior During Therapy [facility] for tasks assessed/performed          Past Medical History:  Diagnosis Date   Asthma    Seizures (HCC)    Past Surgical History:  Procedure Laterality Date   ENDOMETRIAL BIOPSY     EYE SURGERY     SKIN BIOPSY     TONSILLECTOMY     Patient Active Problem List   Diagnosis Date Noted   Allergic rhinitis 05/13/2020   Anxiety disorder 05/13/2020   Asthma 05/13/2020   Gastro-esophageal reflux disease without esophagitis 05/13/2020   Morbid obesity (HCC) 05/13/2020   Recurrent major depression (HCC) 05/13/2020   Vitamin D deficiency 05/13/2020    PCP: Redmon, Noelle, PA   REFERRING PROVIDER: Gershon Donnice SAUNDERS, DPM  REFERRING DIAG: [phone] (ICD-10-CM) - Ankle instability, left  THERAPY DIAG:  Muscle weakness (generalized)  Other abnormalities of gait and mobility  Sprain and strain of left ankle  Rationale for Evaluation and Treatment: Rehabilitation  ONSET DATE: chronic  SUBJECTIVE:   SUBJECTIVE STATEMENT: Ankle Pain     Patient states a year ago she got out her truck and sprain her ankle and it became swollen. Patient put ice on it and took aleve for pain. The swollen ess came back after jumping off swing and patient just wants to make sure that she did not brake any bones.   PERTINENT HISTORY: The patient presents with a year-long history of right ankle pain and swelling. The symptoms began after a fall from her truck, during which she twisted her ankle. She  self-treated with ice, wraps, and exercises, which led to some improvement unless she was on her feet all day. The swelling would occasionally return, especially after working in her uneven yard.  Her symptoms improved.  Recently, the swelling has worsened and spread to the foot after an incident where she jumped off a swing. She denies any specific injury associated with this recent exacerbation. PAIN:  Are you having pain? Yes: NPRS scale: 4/10 Pain location: R ankle Pain description: ache Aggravating factors: prolonged standing Relieving factors: rest  PRECAUTIONS: None  RED FLAGS: None   WEIGHT BEARING RESTRICTIONS: No  FALLS:  Has patient fallen in last 6 months? No  OCCUPATION: MD  PLOF: Independent  PATIENT GOALS: To manage my ankle symptoms  NEXT MD VISIT: TBD  OBJECTIVE:  Note: Objective measures were completed at Evaluation unless otherwise noted.  DIAGNOSTIC FINDINGS: Bones:  No fracture or contusion pattern. No accelerated arthrosis is present.   Ligaments: The anterior talofibular ligament is poorly defined. The posterior talofibular ligament is intact. The anterior and posterior tibiofibular ligaments are intact. Deltoid ligament and spring ligaments are intact. The sinus tarsi is unremarkable.   Musculotendinous structures: The tibialis anterior, extensor digitorum and extensor hallucis longus tendons are unremarkable. The posterior tibial tendon, flexor hallucis longus and flexor digitorum tendons are unremarkable. Peroneal tendons demonstrate no significant abnormality. No significant tenosynovitis  or tendinosis. The Achilles tendon and plantar fascia are unremarkable.   IMPRESSION: No significant bony or musculotendinous abnormality is appreciated.   The anterior talofibular ligament is indistinct suggesting a chronic injury.   Moderate subcutaneous edema of uncertain etiology.   Electronically signed by: Norleen Satchel MD 10/03/2023 09:05 AM EDT  RP Workstation: [id number]  PATIENT SURVEYS:  LEFS 69/80   MUSCLE LENGTH: Not tested  POSTURE: No Significant postural limitations  PALPATION: Global tenderness to anterior/medial/lateral ankle with focal TTP L ATF  LOWER EXTREMITY ROM: WNL  Active ROM Right eval Left eval  Hip flexion    Hip extension    Hip abduction    Hip adduction    Hip internal rotation    Hip external rotation    Knee flexion    Knee extension    Ankle dorsiflexion    Ankle plantarflexion    Ankle inversion    Ankle eversion     (Blank rows = not tested)  LOWER EXTREMITY MMT:  MMT Right eval Left eval  Hip flexion    Hip extension    Hip abduction    Hip adduction    Hip internal rotation    Hip external rotation    Knee flexion    Knee extension    Ankle dorsiflexion    Ankle plantarflexion    Ankle inversion    Ankle eversion     (Blank rows = not tested)  LOWER EXTREMITY SPECIAL TESTS:  Ankle special tests: Anterior drawer test: negative, Tinel's test-Posterior tibialis: negative, Tinel's test-Deep peroneal: negative, and Great toe extension test: negative  FUNCTIONAL TESTS:  30 seconds chair stand test 1 rep  GAIT: Distance walked: 51ft x2 Assistive device utilized: None Level of assistance: Complete Independence Comments: WFL                                                                                                                                TREATMENT:  [facility] Adult PT Treatment:                                                DATE: 10/17/23 Eval and HEP  Self Care: Additional minutes spent for educating on updated Therapeutic Home Exercise Program as well as comparing current status to condition at start of symptoms. This included exercises focusing on stretching, strengthening, with focus on eccentric aspects. Long term goals include an improvement in range of motion, strength, endurance as well as avoiding reinjury. Patient's frequency would include in 1-2  times a day, 3-5 times a week for a duration of 6-12 weeks. Proper technique shown and discussed handout in great detail. All questions were discussed and addressed.      PATIENT EDUCATION:  Education details: Discussed eval findings, rehab rationale and POC and patient is in agreement  Person educated: Patient Education method: Explanation and Handouts Education comprehension: verbalized understanding and needs further education  HOME EXERCISE PROGRAM: Access Code: [email or web address] URL: [email or web address] Date: 10/17/2023 Prepared by: Reyes Kohut  Exercises - Gastroc Stretch on Wall  - 2 x daily - 5 x weekly - 1 sets - 2 reps - 30s hold - Soleus Stretch on Wall  - 2 x daily - 5 x weekly - 1 sets - 2 reps - 30s hold - Single Leg Stance  - 2 x daily - 5 x weekly - 1 sets - 2 reps - 20s hold - Single Leg Heel Raise with Unilateral Counter Support  - 2 x daily - 5 x weekly - 1 sets - 10-15 reps - Toe Raise With Back Against Wall  - 2 x daily - 5 x weekly - 1 sets - 10-15 reps  ASSESSMENT:  CLINICAL IMPRESSION: Patient is a 48 y.o. female who was seen today for physical therapy evaluation and treatment for chronic L ankle pain and instability. Patient presents with full ROM, decreased L LE proprioception and mild strength deficits.  Some instability noted with inversion and ATF stressors.  Patient is a good candidate for OPPT to establish a home based program to address strength and proprioceptive issues.  OBJECTIVE IMPAIRMENTS: Abnormal gait, decreased activity tolerance, decreased balance, decreased endurance, decreased knowledge of condition, decreased mobility, difficulty walking, impaired perceived functional ability, impaired flexibility, obesity, and pain.   ACTIVITY LIMITATIONS: carrying, standing, squatting, stairs, and locomotion level  PERSONAL FACTORS: Age, Behavior pattern, Fitness, and Time since onset of injury/illness/exacerbation are also affecting patient's  functional outcome.   REHAB POTENTIAL: Good  CLINICAL DECISION MAKING: Stable/uncomplicated  EVALUATION COMPLEXITY: Low   GOALS: Goals reviewed with patient? No    SHORT TERM GOALS=LONG TERM GOALS: Target date: 12/17/23  Patient will score at least 75/80 on LEFS to signify clinically meaningful improvement in functional abilities.   Baseline: 69/80 Goal status: INITIAL  2.  Patient will increase 30s chair stand reps from 1 to 8 with/without arms to demonstrate and improved functional ability with less pain/difficulty as well as reduce fall risk.  Baseline: 1 Goal status: INITIAL  3.  Patient will acknowledge 2/10 pain at least once during episode of care   Baseline: 4/10 Goal status: INITIAL  4.  Patient to demonstrate independence in HEP  Baseline: [id number] Goal status: INITIAL    PLAN:  PT FREQUENCY: 1-2x/week  PT DURATION: 6 weeks  PLANNED INTERVENTIONS: 97110-Therapeutic exercises, 97530- Therapeutic activity, 97112- Neuromuscular re-education, 97535- Self Care, [id number]- Manual therapy, [phone]- Gait training, Patient/Family education, Balance training, Stair training, Taping, and Joint mobilization  PLAN FOR NEXT SESSION: HEP review and update, manual techniques as appropriate, aerobic tasks, ROM and flexibility activities, strengthening and PREs, TPDN, gait and balance training as needed     Reyes CHRISTELLA Kohut, PT 10/17/2023, 9:29 AM

## 2023-10-17 ENCOUNTER — Ambulatory Visit: Attending: Podiatry

## 2023-10-17 DIAGNOSIS — M25372 Other instability, left ankle: Secondary | ICD-10-CM | POA: Insufficient documentation

## 2023-10-17 DIAGNOSIS — S93491D Sprain of other ligament of right ankle, subsequent encounter: Secondary | ICD-10-CM | POA: Diagnosis not present

## 2023-10-17 DIAGNOSIS — S93402A Sprain of unspecified ligament of left ankle, initial encounter: Secondary | ICD-10-CM | POA: Insufficient documentation

## 2023-10-17 DIAGNOSIS — S96912A Strain of unspecified muscle and tendon at ankle and foot level, left foot, initial encounter: Secondary | ICD-10-CM | POA: Insufficient documentation

## 2023-10-17 DIAGNOSIS — M6281 Muscle weakness (generalized): Secondary | ICD-10-CM | POA: Diagnosis present

## 2023-10-17 DIAGNOSIS — R2689 Other abnormalities of gait and mobility: Secondary | ICD-10-CM | POA: Insufficient documentation

## 2023-10-18 ENCOUNTER — Ambulatory Visit
Admission: RE | Admit: 2023-10-18 | Discharge: 2023-10-18 | Disposition: A | Discharge status: Home / Self Care | Source: Ambulatory Visit | Attending: Obstetrics and Gynecology | Admitting: Obstetrics and Gynecology

## 2023-10-18 DIAGNOSIS — D219 Benign neoplasm of connective and other soft tissue, unspecified: Secondary | ICD-10-CM

## 2023-10-18 MED ORDER — IOPAMIDOL (ISOVUE-370) INJECTION 76%
75.0000 mL | Freq: Once | INTRAVENOUS | Status: AC | PRN
  Administered 2023-10-18: 75 mL via INTRAVENOUS

## 2023-10-26 ENCOUNTER — Encounter

## 2023-10-26 NOTE — Therapy (Unsigned)
 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 DOB:November 17, 1975, 48 y.o., female Today's Date: 10/27/2023  END OF SESSION:  PT End of Session - 10/27/23 1542     Visit Number 2    Number of Visits 4    Date for PT Re-Evaluation 12/17/23    Authorization Type BCBS    PT Start Time 1542   late arrival   PT Stop Time 1610    PT Time Calculation (min) 28 min    Activity Tolerance Patient tolerated treatment well    Behavior During Therapy [facility] for tasks assessed/performed           Past Medical History:  Diagnosis Date   Asthma    Seizures (HCC)    Past Surgical History:  Procedure Laterality Date   ENDOMETRIAL BIOPSY     EYE SURGERY     SKIN BIOPSY     TONSILLECTOMY     Patient Active Problem List   Diagnosis Date Noted   Allergic rhinitis 05/13/2020   Anxiety disorder 05/13/2020   Asthma 05/13/2020   Gastro-esophageal reflux disease without esophagitis 05/13/2020   Morbid obesity (HCC) 05/13/2020   Recurrent major depression (HCC) 05/13/2020   Vitamin D deficiency 05/13/2020    PCP: Redmon, Noelle, PA   REFERRING PROVIDER: Gershon Donnice SAUNDERS, DPM  REFERRING DIAG: [phone] (ICD-10-CM) - Ankle instability, left  THERAPY DIAG:  Muscle weakness (generalized)  Other abnormalities of gait and mobility  Sprain and strain of left ankle  Rationale for Evaluation and Treatment: Rehabilitation  ONSET DATE: chronic  SUBJECTIVE:   SUBJECTIVE STATEMENT: Feels a bit better.  Still has symptoms on busy days.  Has been compliant with HEP  PERTINENT HISTORY: The patient presents with a year-long history of right ankle pain and swelling. The symptoms began after a fall from her truck, during which she twisted her ankle. She self-treated with ice, wraps, and exercises, which led to some improvement unless she was on her feet all day. The swelling would occasionally return, especially after working in her uneven yard.   Her symptoms improved.  Recently, the swelling has worsened and spread to the foot after an incident where she jumped off a swing. She denies any specific injury associated with this recent exacerbation. PAIN:  Are you having pain? Yes: NPRS scale: 4/10 Pain location: R ankle Pain description: ache Aggravating factors: prolonged standing Relieving factors: rest  PRECAUTIONS: None  RED FLAGS: None   WEIGHT BEARING RESTRICTIONS: No  FALLS:  Has patient fallen in last 6 months? No  OCCUPATION: MD  PLOF: Independent  PATIENT GOALS: To manage my ankle symptoms  NEXT MD VISIT: TBD  OBJECTIVE:  Note: Objective measures were completed at Evaluation unless otherwise noted.  DIAGNOSTIC FINDINGS: Bones:  No fracture or contusion pattern. No accelerated arthrosis is present.   Ligaments: The anterior talofibular ligament is poorly defined. The posterior talofibular ligament is intact. The anterior and posterior tibiofibular ligaments are intact. Deltoid ligament and spring ligaments are intact. The sinus tarsi is unremarkable.   Musculotendinous structures: The tibialis anterior, extensor digitorum and extensor hallucis longus tendons are unremarkable. The posterior tibial tendon, flexor hallucis longus and flexor digitorum tendons are unremarkable. Peroneal tendons demonstrate no significant abnormality. No significant tenosynovitis or tendinosis. The Achilles tendon and plantar fascia are unremarkable.   IMPRESSION: No significant bony or musculotendinous abnormality is appreciated.   The anterior talofibular ligament is indistinct suggesting a chronic injury.   Moderate subcutaneous edema  of uncertain etiology.   Electronically signed by: Norleen Satchel MD 10/03/2023 09:05 AM EDT RP Workstation: [id number]  PATIENT SURVEYS:  LEFS 69/80   MUSCLE LENGTH: Not tested  POSTURE: No Significant postural limitations  PALPATION: Global tenderness to anterior/medial/lateral  ankle with focal TTP L ATF  LOWER EXTREMITY ROM: WNL  Active ROM Right eval Left eval  Hip flexion    Hip extension    Hip abduction    Hip adduction    Hip internal rotation    Hip external rotation    Knee flexion    Knee extension    Ankle dorsiflexion    Ankle plantarflexion    Ankle inversion    Ankle eversion     (Blank rows = not tested)  LOWER EXTREMITY MMT:  MMT Right eval Left eval  Hip flexion    Hip extension    Hip abduction    Hip adduction    Hip internal rotation    Hip external rotation    Knee flexion    Knee extension    Ankle dorsiflexion    Ankle plantarflexion    Ankle inversion    Ankle eversion     (Blank rows = not tested)  LOWER EXTREMITY SPECIAL TESTS:  Ankle special tests: Anterior drawer test: negative, Tinel's test-Posterior tibialis: negative, Tinel's test-Deep peroneal: negative, and Great toe extension test: negative  FUNCTIONAL TESTS:  30 seconds chair stand test 1 rep  GAIT: Distance walked: 92ft x2 Assistive device utilized: None Level of assistance: Complete Independence Comments: WFL                                                                                                                                TREATMENT:  [facility] Adult PT Treatment:                                                DATE: 10/27/23 Therapeutic Exercise: Nustep L4 6 min HEP review and demo  Therapeutic Activity: 3 way ankle YTB 15x2 SLS L rearfoot only 30s x2 UE support Runners step 4 in 10/10  [facility] Adult PT Treatment:                                                DATE: 10/17/23 Eval and HEP  Self Care: Additional minutes spent for educating on updated Therapeutic Home Exercise Program as well as comparing current status to condition at start of symptoms. This included exercises focusing on stretching, strengthening, with focus on eccentric aspects. Long term goals include an improvement in range of motion, strength, endurance as well as  avoiding reinjury. Patient's frequency would include in 1-2 times a day, 3-5  times a week for a duration of 6-12 weeks. Proper technique shown and discussed handout in great detail. All questions were discussed and addressed.      PATIENT EDUCATION:  Education details: Discussed eval findings, rehab rationale and POC and patient is in agreement  Person educated: Patient Education method: Explanation and Handouts Education comprehension: verbalized understanding and needs further education  HOME EXERCISE PROGRAM: Access Code: [email or web address] URL: [email or web address] Date: 10/27/2023 Prepared by: Kirsta Probert  Exercises - Single Leg Heel Raise with Unilateral Counter Support  - 2 x daily - 5 x weekly - 1 sets - 10-15 reps - Toe Raise With Back Against Wall  - 2 x daily - 5 x weekly - 1 sets - 10-15 reps - Single Leg Stance with Support  - 2 x daily - 5 x weekly - 1 sets - 1 reps - 30s hold - Ankle Inversion with Resistance  - 1 x daily - 5 x weekly - 2 sets - 15 reps - Ankle Eversion with Resistance  - 1 x daily - 5 x weekly - 2 sets - 15 reps - Runner's Step Up/Down  - 1 x daily - 5 x weekly - 1 sets - 10 reps  ASSESSMENT:  CLINICAL IMPRESSION:  Focus of today was review and update of HEP.  Incorporated additional balance and proprioceptive tasks to challenge function.  Proprioceptive deficits observed during functional tasks.  Patient is a 48 y.o. female who was seen today for physical therapy evaluation and treatment for chronic L ankle pain and instability. Patient presents with full ROM, decreased L LE proprioception and mild strength deficits.  Some instability noted with inversion and ATF stressors.  Patient is a good candidate for OPPT to establish a home based program to address strength and proprioceptive issues.  OBJECTIVE IMPAIRMENTS: Abnormal gait, decreased activity tolerance, decreased balance, decreased endurance, decreased knowledge of condition, decreased  mobility, difficulty walking, impaired perceived functional ability, impaired flexibility, obesity, and pain.   ACTIVITY LIMITATIONS: carrying, standing, squatting, stairs, and locomotion level  PERSONAL FACTORS: Age, Behavior pattern, Fitness, and Time since onset of injury/illness/exacerbation are also affecting patient's functional outcome.   REHAB POTENTIAL: Good  CLINICAL DECISION MAKING: Stable/uncomplicated  EVALUATION COMPLEXITY: Low   GOALS: Goals reviewed with patient? No    SHORT TERM GOALS=LONG TERM GOALS: Target date: 12/17/23  Patient will score at least 75/80 on LEFS to signify clinically meaningful improvement in functional abilities.   Baseline: 69/80 Goal status: INITIAL  2.  Patient will increase 30s chair stand reps from 1 to 8 with/without arms to demonstrate and improved functional ability with less pain/difficulty as well as reduce fall Palmer.  Baseline: 1 Goal status: INITIAL  3.  Patient will acknowledge 2/10 pain at least once during episode of care   Baseline: 4/10 Goal status: INITIAL  4.  Patient to demonstrate independence in HEP  Baseline: [id number] Goal status: INITIAL    PLAN:  PT FREQUENCY: 1-2x/week  PT DURATION: 6 weeks  PLANNED INTERVENTIONS: 97110-Therapeutic exercises, 97530- Therapeutic activity, 97112- Neuromuscular re-education, 97535- Self Care, [id number]- Manual therapy, [phone]- Gait training, Patient/Family education, Balance training, Stair training, Taping, and Joint mobilization  PLAN FOR NEXT SESSION: HEP review and update, manual techniques as appropriate, aerobic tasks, ROM and flexibility activities, strengthening and PREs, TPDN, gait and balance training as needed     Reyes CHRISTELLA Kohut, PT 10/27/2023, 5:23 PM

## 2023-10-27 ENCOUNTER — Ambulatory Visit: Attending: Podiatry

## 2023-10-27 DIAGNOSIS — S93402A Sprain of unspecified ligament of left ankle, initial encounter: Secondary | ICD-10-CM | POA: Diagnosis present

## 2023-10-27 DIAGNOSIS — S96912A Strain of unspecified muscle and tendon at ankle and foot level, left foot, initial encounter: Secondary | ICD-10-CM | POA: Insufficient documentation

## 2023-10-27 DIAGNOSIS — R2689 Other abnormalities of gait and mobility: Secondary | ICD-10-CM | POA: Diagnosis present

## 2023-10-27 DIAGNOSIS — M6281 Muscle weakness (generalized): Secondary | ICD-10-CM | POA: Diagnosis present

## 2023-11-22 NOTE — Therapy (Unsigned)
 OUTPATIENT PHYSICAL THERAPY TREATMENT NOTE   Patient Name: Megan Krajewski, MD MRN: [id number] DOB:05-Jun-1975, 48 y.o., female Today's Date: 11/22/2023  END OF SESSION:     Past Medical History:  Diagnosis Date   Asthma    Seizures (HCC)    Past Surgical History:  Procedure Laterality Date   ENDOMETRIAL BIOPSY     EYE SURGERY     SKIN BIOPSY     TONSILLECTOMY     Patient Active Problem List   Diagnosis Date Noted   Allergic rhinitis 05/13/2020   Anxiety disorder 05/13/2020   Asthma 05/13/2020   Gastro-esophageal reflux disease without esophagitis 05/13/2020   Morbid obesity (HCC) 05/13/2020   Recurrent major depression (HCC) 05/13/2020   Vitamin D deficiency 05/13/2020    PCP: Redmon, Noelle, PA   REFERRING PROVIDER: Gershon Donnice SAUNDERS, DPM  REFERRING DIAG: [phone] (ICD-10-CM) - Ankle instability, left  THERAPY DIAG:  No diagnosis found.  Rationale for Evaluation and Treatment: Rehabilitation  ONSET DATE: chronic  SUBJECTIVE:   SUBJECTIVE STATEMENT: Feels a bit better.  Still has symptoms on busy days.  Has been compliant with HEP  PERTINENT HISTORY: The patient presents with a year-long history of right ankle pain and swelling. The symptoms began after a fall from her truck, during which she twisted her ankle. She self-treated with ice, wraps, and exercises, which led to some improvement unless she was on her feet all day. The swelling would occasionally return, especially after working in her uneven yard.  Her symptoms improved.  Recently, the swelling has worsened and spread to the foot after an incident where she jumped off a swing. She denies any specific injury associated with this recent exacerbation. PAIN:  Are you having pain? Yes: NPRS scale: 4/10 Pain location: R ankle Pain description: ache Aggravating factors: prolonged standing Relieving factors: rest  PRECAUTIONS: None  RED FLAGS: None   WEIGHT BEARING RESTRICTIONS:  No  FALLS:  Has patient fallen in last 6 months? No  OCCUPATION: MD  PLOF: Independent  PATIENT GOALS: To manage my ankle symptoms  NEXT MD VISIT: TBD  OBJECTIVE:  Note: Objective measures were completed at Evaluation unless otherwise noted.  DIAGNOSTIC FINDINGS: Bones:  No fracture or contusion pattern. No accelerated arthrosis is present.   Ligaments: The anterior talofibular ligament is poorly defined. The posterior talofibular ligament is intact. The anterior and posterior tibiofibular ligaments are intact. Deltoid ligament and spring ligaments are intact. The sinus tarsi is unremarkable.   Musculotendinous structures: The tibialis anterior, extensor digitorum and extensor hallucis longus tendons are unremarkable. The posterior tibial tendon, flexor hallucis longus and flexor digitorum tendons are unremarkable. Peroneal tendons demonstrate no significant abnormality. No significant tenosynovitis or tendinosis. The Achilles tendon and plantar fascia are unremarkable.   IMPRESSION: No significant bony or musculotendinous abnormality is appreciated.   The anterior talofibular ligament is indistinct suggesting a chronic injury.   Moderate subcutaneous edema of uncertain etiology.   Electronically signed by: Norleen Satchel MD 10/03/2023 09:05 AM EDT RP Workstation: [id number]  PATIENT SURVEYS:  LEFS 69/80   MUSCLE LENGTH: Not tested  POSTURE: No Significant postural limitations  PALPATION: Global tenderness to anterior/medial/lateral ankle with focal TTP L ATF  LOWER EXTREMITY ROM: WNL  Active ROM Right eval Left eval  Hip flexion    Hip extension    Hip abduction    Hip adduction    Hip internal rotation    Hip external rotation    Knee flexion    Knee extension  Ankle dorsiflexion    Ankle plantarflexion    Ankle inversion    Ankle eversion     (Blank rows = not tested)  LOWER EXTREMITY MMT:  MMT Right eval Left eval  Hip flexion    Hip  extension    Hip abduction    Hip adduction    Hip internal rotation    Hip external rotation    Knee flexion    Knee extension    Ankle dorsiflexion    Ankle plantarflexion    Ankle inversion    Ankle eversion     (Blank rows = not tested)  LOWER EXTREMITY SPECIAL TESTS:  Ankle special tests: Anterior drawer test: negative, Tinel's test-Posterior tibialis: negative, Tinel's test-Deep peroneal: negative, and Great toe extension test: negative  FUNCTIONAL TESTS:  30 seconds chair stand test 1 rep  GAIT: Distance walked: 74ft x2 Assistive device utilized: None Level of assistance: Complete Independence Comments: WFL                                                                                                                                TREATMENT:  [facility] Adult PT Treatment:                                                DATE: 10/27/23 Therapeutic Exercise: Nustep L4 6 min HEP review and demo  Therapeutic Activity: 3 way ankle YTB 15x2 SLS L rearfoot only 30s x2 UE support Runners step 4 in 10/10  [facility] Adult PT Treatment:                                                DATE: 10/17/23 Eval and HEP  Self Care: Additional minutes spent for educating on updated Therapeutic Home Exercise Program as well as comparing current status to condition at start of symptoms. This included exercises focusing on stretching, strengthening, with focus on eccentric aspects. Long term goals include an improvement in range of motion, strength, endurance as well as avoiding reinjury. Patient's frequency would include in 1-2 times a day, 3-5 times a week for a duration of 6-12 weeks. Proper technique shown and discussed handout in great detail. All questions were discussed and addressed.      PATIENT EDUCATION:  Education details: Discussed eval findings, rehab rationale and POC and patient is in agreement  Person educated: Patient Education method: Explanation and Handouts Education  comprehension: verbalized understanding and needs further education  HOME EXERCISE PROGRAM: Access Code: [email or web address] URL: [email or web address] Date: 10/27/2023 Prepared by: Ercell Perlman  Exercises - Single Leg Heel Raise with Unilateral Counter Support  - 2 x daily - 5 x weekly - 1 sets - 10-15  reps - Toe Raise With Back Against Wall  - 2 x daily - 5 x weekly - 1 sets - 10-15 reps - Single Leg Stance with Support  - 2 x daily - 5 x weekly - 1 sets - 1 reps - 30s hold - Ankle Inversion with Resistance  - 1 x daily - 5 x weekly - 2 sets - 15 reps - Ankle Eversion with Resistance  - 1 x daily - 5 x weekly - 2 sets - 15 reps - Runner's Step Up/Down  - 1 x daily - 5 x weekly - 1 sets - 10 reps  ASSESSMENT:  CLINICAL IMPRESSION:  Focus of today was review and update of HEP.  Incorporated additional balance and proprioceptive tasks to challenge function.  Proprioceptive deficits observed during functional tasks.  Patient is a 48 y.o. female who was seen today for physical therapy evaluation and treatment for chronic L ankle pain and instability. Patient presents with full ROM, decreased L LE proprioception and mild strength deficits.  Some instability noted with inversion and ATF stressors.  Patient is a good candidate for OPPT to establish a home based program to address strength and proprioceptive issues.  OBJECTIVE IMPAIRMENTS: Abnormal gait, decreased activity tolerance, decreased balance, decreased endurance, decreased knowledge of condition, decreased mobility, difficulty walking, impaired perceived functional ability, impaired flexibility, obesity, and pain.   ACTIVITY LIMITATIONS: carrying, standing, squatting, stairs, and locomotion level  PERSONAL FACTORS: Age, Behavior pattern, Fitness, and Time since onset of injury/illness/exacerbation are also affecting patient's functional outcome.   REHAB POTENTIAL: Good  CLINICAL DECISION MAKING:  Stable/uncomplicated  EVALUATION COMPLEXITY: Low   GOALS: Goals reviewed with patient? No    SHORT TERM GOALS=LONG TERM GOALS: Target date: 12/17/23  Patient will score at least 75/80 on LEFS to signify clinically meaningful improvement in functional abilities.   Baseline: 69/80 Goal status: INITIAL  2.  Patient will increase 30s chair stand reps from 1 to 8 with/without arms to demonstrate and improved functional ability with less pain/difficulty as well as reduce fall risk.  Baseline: 1 Goal status: INITIAL  3.  Patient will acknowledge 2/10 pain at least once during episode of care   Baseline: 4/10 Goal status: INITIAL  4.  Patient to demonstrate independence in HEP  Baseline: [id number] Goal status: INITIAL    PLAN:  PT FREQUENCY: 1-2x/week  PT DURATION: 6 weeks  PLANNED INTERVENTIONS: 97110-Therapeutic exercises, 97530- Therapeutic activity, 97112- Neuromuscular re-education, 97535- Self Care, [id number]- Manual therapy, [phone]- Gait training, Patient/Family education, Balance training, Stair training, Taping, and Joint mobilization  PLAN FOR NEXT SESSION: HEP review and update, manual techniques as appropriate, aerobic tasks, ROM and flexibility activities, strengthening and PREs, TPDN, gait and balance training as needed     Reyes CHRISTELLA Kohut, PT 11/22/2023, 2:38 PM

## 2023-11-23 ENCOUNTER — Ambulatory Visit

## 2023-11-23 DIAGNOSIS — S93402A Sprain of unspecified ligament of left ankle, initial encounter: Secondary | ICD-10-CM

## 2023-11-23 DIAGNOSIS — M6281 Muscle weakness (generalized): Secondary | ICD-10-CM

## 2023-11-23 DIAGNOSIS — R2689 Other abnormalities of gait and mobility: Secondary | ICD-10-CM

## 2023-11-23 NOTE — Therapy (Unsigned)
 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 DOB:1976-03-30, 48 y.o., female Today's Date: 11/23/2023  END OF SESSION:     Past Medical History:  Diagnosis Date   Asthma    Seizures (HCC)    Past Surgical History:  Procedure Laterality Date   ENDOMETRIAL BIOPSY     EYE SURGERY     SKIN BIOPSY     TONSILLECTOMY     Patient Active Problem List   Diagnosis Date Noted   Allergic rhinitis 05/13/2020   Anxiety disorder 05/13/2020   Asthma 05/13/2020   Gastro-esophageal reflux disease without esophagitis 05/13/2020   Morbid obesity (HCC) 05/13/2020   Recurrent major depression (HCC) 05/13/2020   Vitamin D deficiency 05/13/2020    PCP: Redmon, Noelle, PA   REFERRING PROVIDER: Gershon Donnice SAUNDERS, DPM  REFERRING DIAG: [phone] (ICD-10-CM) - Ankle instability, left  THERAPY DIAG:  Muscle weakness (generalized)  Other abnormalities of gait and mobility  Sprain and strain of left ankle  Rationale for Evaluation and Treatment: Rehabilitation  ONSET DATE: chronic  SUBJECTIVE:   SUBJECTIVE STATEMENT: Returns from vacation with less pain.  Felt being off of herfeet was helpful.  PERTINENT HISTORY: The patient presents with a year-long history of right ankle pain and swelling. The symptoms began after a fall from her truck, during which she twisted her ankle. She self-treated with ice, wraps, and exercises, which led to some improvement unless she was on her feet all day. The swelling would occasionally return, especially after working in her uneven yard.  Her symptoms improved.  Recently, the swelling has worsened and spread to the foot after an incident where she jumped off a swing. She denies any specific injury associated with this recent exacerbation. PAIN:  Are you having pain? Yes: NPRS scale: 4/10 Pain location: R ankle Pain description: ache Aggravating factors: prolonged standing Relieving factors:  rest  PRECAUTIONS: None  RED FLAGS: None   WEIGHT BEARING RESTRICTIONS: No  FALLS:  Has patient fallen in last 6 months? No  OCCUPATION: MD  PLOF: Independent  PATIENT GOALS: To manage my ankle symptoms  NEXT MD VISIT: TBD  OBJECTIVE:  Note: Objective measures were completed at Evaluation unless otherwise noted.  DIAGNOSTIC FINDINGS: Bones:  No fracture or contusion pattern. No accelerated arthrosis is present.   Ligaments: The anterior talofibular ligament is poorly defined. The posterior talofibular ligament is intact. The anterior and posterior tibiofibular ligaments are intact. Deltoid ligament and spring ligaments are intact. The sinus tarsi is unremarkable.   Musculotendinous structures: The tibialis anterior, extensor digitorum and extensor hallucis longus tendons are unremarkable. The posterior tibial tendon, flexor hallucis longus and flexor digitorum tendons are unremarkable. Peroneal tendons demonstrate no significant abnormality. No significant tenosynovitis or tendinosis. The Achilles tendon and plantar fascia are unremarkable.   IMPRESSION: No significant bony or musculotendinous abnormality is appreciated.   The anterior talofibular ligament is indistinct suggesting a chronic injury.   Moderate subcutaneous edema of uncertain etiology.   Electronically signed by: Norleen Satchel MD 10/03/2023 09:05 AM EDT RP Workstation: [id number]  PATIENT SURVEYS:  LEFS 69/80   MUSCLE LENGTH: Not tested  POSTURE: No Significant postural limitations  PALPATION: Global tenderness to anterior/medial/lateral ankle with focal TTP L ATF  LOWER EXTREMITY ROM: WNL  Active ROM Right eval Left eval  Hip flexion    Hip extension    Hip abduction    Hip adduction    Hip internal rotation    Hip external rotation  Knee flexion    Knee extension    Ankle dorsiflexion    Ankle plantarflexion    Ankle inversion    Ankle eversion     (Blank rows = not  tested)  LOWER EXTREMITY MMT:  MMT Right eval Left eval  Hip flexion    Hip extension    Hip abduction    Hip adduction    Hip internal rotation    Hip external rotation    Knee flexion    Knee extension    Ankle dorsiflexion    Ankle plantarflexion    Ankle inversion    Ankle eversion     (Blank rows = not tested)  LOWER EXTREMITY SPECIAL TESTS:  Ankle special tests: Anterior drawer test: negative, Tinel's test-Posterior tibialis: negative, Tinel's test-Deep peroneal: negative, and Great toe extension test: negative  FUNCTIONAL TESTS:  30 seconds chair stand test 1 rep  GAIT: Distance walked: 38ft x2 Assistive device utilized: None Level of assistance: Complete Independence Comments: WFL                                                                                                                                TREATMENT:  [facility] Adult PT Treatment:                                                DATE: 11/23/23  Manual Therapy: Posterior fibular head taping Taping applied to L lateral ankle using posterior fibular mobilization technique.  Patient instructed to remove tape with any increased pain, skin irritation or in the event the tape loosens and can not be re-applied. Cautioned to never apply Leukotape directly over skin without protective cover roll in place.   Therapeutic Activity: Heel raises from 4 in step 15x STS from airex Runners step 6 in 10/10 3 way ankle RTB 15x  [facility] Adult PT Treatment:                                                DATE: 10/27/23 Therapeutic Exercise: Nustep L4 6 min HEP review and demo  Therapeutic Activity: 3 way ankle YTB 15x2 SLS L rearfoot only 30s x2 UE support Runners step 4 in 10/10  [facility] Adult PT Treatment:                                                DATE: 10/17/23 Eval and HEP  Self Care: Additional minutes spent for educating on updated Therapeutic Home Exercise Program as well as comparing current status to  condition at start  of symptoms. This included exercises focusing on stretching, strengthening, with focus on eccentric aspects. Long term goals include an improvement in range of motion, strength, endurance as well as avoiding reinjury. Patient's frequency would include in 1-2 times a day, 3-5 times a week for a duration of 6-12 weeks. Proper technique shown and discussed handout in great detail. All questions were discussed and addressed.      PATIENT EDUCATION:  Education details: Discussed eval findings, rehab rationale and POC and patient is in agreement  Person educated: Patient Education method: Explanation and Handouts Education comprehension: verbalized understanding and needs further education  HOME EXERCISE PROGRAM: Access Code: [email or web address] URL: [email or web address] Date: 10/27/2023 Prepared by: Archie Shea  Exercises - Single Leg Heel Raise with Unilateral Counter Support  - 2 x daily - 5 x weekly - 1 sets - 10-15 reps - Toe Raise With Back Against Wall  - 2 x daily - 5 x weekly - 1 sets - 10-15 reps - Single Leg Stance with Support  - 2 x daily - 5 x weekly - 1 sets - 1 reps - 30s hold - Ankle Inversion with Resistance  - 1 x daily - 5 x weekly - 2 sets - 15 reps - Ankle Eversion with Resistance  - 1 x daily - 5 x weekly - 2 sets - 15 reps - Runner's Step Up/Down  - 1 x daily - 5 x weekly - 1 sets - 10 reps  ASSESSMENT:  CLINICAL IMPRESSION:  Incorporated taping for pain.  Increased resistance on t-band exercises as well as runners step and heel raises.  Posterior fibular head taping beneficial.  Will re-assess at 1 week.  Patient is a 48 y.o. female who was seen today for physical therapy evaluation and treatment for chronic L ankle pain and instability. Patient presents with full ROM, decreased L LE proprioception and mild strength deficits.  Some instability noted with inversion and ATF stressors.  Patient is a good candidate for OPPT to establish a home based  program to address strength and proprioceptive issues.  OBJECTIVE IMPAIRMENTS: Abnormal gait, decreased activity tolerance, decreased balance, decreased endurance, decreased knowledge of condition, decreased mobility, difficulty walking, impaired perceived functional ability, impaired flexibility, obesity, and pain.   ACTIVITY LIMITATIONS: carrying, standing, squatting, stairs, and locomotion level  PERSONAL FACTORS: Age, Behavior pattern, Fitness, and Time since onset of injury/illness/exacerbation are also affecting patient's functional outcome.   REHAB POTENTIAL: Good  CLINICAL DECISION MAKING: Stable/uncomplicated  EVALUATION COMPLEXITY: Low   GOALS: Goals reviewed with patient? No    SHORT TERM GOALS=LONG TERM GOALS: Target date: 12/17/23  Patient will score at least 75/80 on LEFS to signify clinically meaningful improvement in functional abilities.   Baseline: 69/80 Goal status: INITIAL  2.  Patient will increase 30s chair stand reps from 1 to 8 with/without arms to demonstrate and improved functional ability with less pain/difficulty as well as reduce fall risk.  Baseline: 1 Goal status: INITIAL  3.  Patient will acknowledge 2/10 pain at least once during episode of care   Baseline: 4/10 Goal status: INITIAL  4.  Patient to demonstrate independence in HEP  Baseline: [id number] Goal status: INITIAL    PLAN:  PT FREQUENCY: 1-2x/week  PT DURATION: 6 weeks  PLANNED INTERVENTIONS: 97110-Therapeutic exercises, 97530- Therapeutic activity, 97112- Neuromuscular re-education, 97535- Self Care, 02859- Manual therapy, [phone]- Gait training, Patient/Family education, Balance training, Stair training, Taping, and Joint mobilization  PLAN FOR NEXT SESSION: HEP review and update, manual techniques as appropriate, aerobic  tasks, ROM and flexibility activities, strengthening and PREs, TPDN, gait and balance training as needed     Reyes CHRISTELLA Kohut, PT 11/23/2023, 6:40 PM

## 2023-11-29 NOTE — Therapy (Unsigned)
 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 DOB:1976/01/11, 48 y.o., female Today's Date: 11/30/2023  END OF SESSION:  PT End of Session - 11/30/23 1739     Visit Number 4    Number of Visits 4    Date for PT Re-Evaluation 12/17/23    Authorization Type [employer]    PT Start Time 1737    PT Stop Time 1815    PT Time Calculation (min) 38 min    Activity Tolerance Patient tolerated treatment well    Behavior During Therapy [facility] for tasks assessed/performed            Past Medical History:  Diagnosis Date   Asthma    Seizures (HCC)    Past Surgical History:  Procedure Laterality Date   ENDOMETRIAL BIOPSY     EYE SURGERY     SKIN BIOPSY     TONSILLECTOMY     Patient Active Problem List   Diagnosis Date Noted   Allergic rhinitis 05/13/2020   Anxiety disorder 05/13/2020   Asthma 05/13/2020   Gastro-esophageal reflux disease without esophagitis 05/13/2020   Morbid obesity (HCC) 05/13/2020   Recurrent major depression (HCC) 05/13/2020   Vitamin D deficiency 05/13/2020    PCP: Redmon, Noelle, PA   REFERRING PROVIDER: Gershon Donnice SAUNDERS, DPM  REFERRING DIAG: [phone] (ICD-10-CM) - Ankle instability, left  THERAPY DIAG:  Muscle weakness (generalized)  Other abnormalities of gait and mobility  Sprain and strain of left ankle  Rationale for Evaluation and Treatment: Rehabilitation  ONSET DATE: chronic  SUBJECTIVE:   SUBJECTIVE STATEMENT: Taped helped when applied but symptoms returned once removed.  Rates herself at 60%  PERTINENT HISTORY: The patient presents with a year-long history of right ankle pain and swelling. The symptoms began after a fall from her truck, during which she twisted her ankle. She self-treated with ice, wraps, and exercises, which led to some improvement unless she was on her feet all day. The swelling would occasionally return, especially after working in her uneven yard.  Her  symptoms improved.  Recently, the swelling has worsened and spread to the foot after an incident where she jumped off a swing. She denies any specific injury associated with this recent exacerbation. PAIN:  Are you having pain? Yes: NPRS scale: 4/10 Pain location: R ankle Pain description: ache Aggravating factors: prolonged standing Relieving factors: rest  PRECAUTIONS: None  RED FLAGS: None   WEIGHT BEARING RESTRICTIONS: No  FALLS:  Has patient fallen in last 6 months? No  OCCUPATION: MD  PLOF: Independent  PATIENT GOALS: To manage my ankle symptoms  NEXT MD VISIT: TBD  OBJECTIVE:  Note: Objective measures were completed at Evaluation unless otherwise noted.  DIAGNOSTIC FINDINGS: Bones:  No fracture or contusion pattern. No accelerated arthrosis is present.   Ligaments: The anterior talofibular ligament is poorly defined. The posterior talofibular ligament is intact. The anterior and posterior tibiofibular ligaments are intact. Deltoid ligament and spring ligaments are intact. The sinus tarsi is unremarkable.   Musculotendinous structures: The tibialis anterior, extensor digitorum and extensor hallucis longus tendons are unremarkable. The posterior tibial tendon, flexor hallucis longus and flexor digitorum tendons are unremarkable. Peroneal tendons demonstrate no significant abnormality. No significant tenosynovitis or tendinosis. The Achilles tendon and plantar fascia are unremarkable.   IMPRESSION: No significant bony or musculotendinous abnormality is appreciated.   The anterior talofibular ligament is indistinct suggesting a chronic injury.   Moderate subcutaneous edema of uncertain etiology.  Electronically signed by: Norleen Satchel MD 10/03/2023 09:05 AM EDT RP Workstation: [id number]  PATIENT SURVEYS:  LEFS 69/80; 11/30/23 66/80   MUSCLE LENGTH: Not tested  POSTURE: No Significant postural limitations  PALPATION: Global tenderness to  anterior/medial/lateral ankle with focal TTP L ATF  LOWER EXTREMITY ROM: WNL  Active ROM Right eval Left eval  Hip flexion    Hip extension    Hip abduction    Hip adduction    Hip internal rotation    Hip external rotation    Knee flexion    Knee extension    Ankle dorsiflexion    Ankle plantarflexion    Ankle inversion    Ankle eversion     (Blank rows = not tested)  LOWER EXTREMITY MMT:  MMT Right eval Left eval  Hip flexion    Hip extension    Hip abduction    Hip adduction    Hip internal rotation    Hip external rotation    Knee flexion    Knee extension    Ankle dorsiflexion    Ankle plantarflexion    Ankle inversion    Ankle eversion     (Blank rows = not tested)  LOWER EXTREMITY SPECIAL TESTS:  Ankle special tests: Anterior drawer test: negative, Tinel's test-Posterior tibialis: negative, Tinel's test-Deep peroneal: negative, and Great toe extension test: negative  FUNCTIONAL TESTS:  30 seconds chair stand test 1 rep; 11/30/23 10 reps  GAIT: Distance walked: 6ft x2 Assistive device utilized: None Level of assistance: Complete Independence Comments: WFL                                                                                                                                TREATMENT:  [facility] Adult PT Treatment:                                                DATE: 11/30/23 Therapeutic Exercise: Nustep L4 6 min Therapeutic Activity: 30s chair stand test 10 reps Posterior fibular head taping Ankle clock Self Care: HEP review and update, discussion of brace and compression hose wear, benefit of more supportive shoes to control supination  [facility] Adult PT Treatment:                                                DATE: 10/27/23 Therapeutic Exercise: Nustep L4 6 min HEP review and demo  Therapeutic Activity: 3 way ankle YTB 15x2 SLS L rearfoot only 30s x2 UE support Runners step 4 in 10/10  [facility] Adult PT Treatment:  DATE: 10/17/23 Eval and HEP  Self Care: Additional minutes spent for educating on updated Therapeutic Home Exercise Program as well as comparing current status to condition at start of symptoms. This included exercises focusing on stretching, strengthening, with focus on eccentric aspects. Long term goals include an improvement in range of motion, strength, endurance as well as avoiding reinjury. Patient's frequency would include in 1-2 times a day, 3-5 times a week for a duration of 6-12 weeks. Proper technique shown and discussed handout in great detail. All questions were discussed and addressed.      PATIENT EDUCATION:  Education details: Discussed eval findings, rehab rationale and POC and patient is in agreement  Person educated: Patient Education method: Explanation and Handouts Education comprehension: verbalized understanding and needs further education  HOME EXERCISE PROGRAM: Access Code: [email or web address] URL: [email or web address] Date: 10/27/2023 Prepared by: Cordale Manera  Exercises - Single Leg Heel Raise with Unilateral Counter Support  - 2 x daily - 5 x weekly - 1 sets - 10-15 reps - Toe Raise With Back Against Wall  - 2 x daily - 5 x weekly - 1 sets - 10-15 reps - Single Leg Stance with Support  - 2 x daily - 5 x weekly - 1 sets - 1 reps - 30s hold - Ankle Inversion with Resistance  - 1 x daily - 5 x weekly - 2 sets - 15 reps - Ankle Eversion with Resistance  - 1 x daily - 5 x weekly - 2 sets - 15 reps - Runner's Step Up/Down  - 1 x daily - 5 x weekly - 1 sets - 10 reps  ASSESSMENT:  CLINICAL IMPRESSION:  Continued taping.  Added ankle clock to HEP to challenge strength, balance and proprioception.  Discussed benefit of obtaining more supportive shoes to control supination.  Patient may f/u if new shoes do not benefit symptoms.  Patient is a 48 y.o. female who was seen today for physical therapy evaluation and treatment for chronic L ankle pain and  instability. Patient presents with full ROM, decreased L LE proprioception and mild strength deficits.  Some instability noted with inversion and ATF stressors.  Patient is a good candidate for OPPT to establish a home based program to address strength and proprioceptive issues.  OBJECTIVE IMPAIRMENTS: Abnormal gait, decreased activity tolerance, decreased balance, decreased endurance, decreased knowledge of condition, decreased mobility, difficulty walking, impaired perceived functional ability, impaired flexibility, obesity, and pain.   ACTIVITY LIMITATIONS: carrying, standing, squatting, stairs, and locomotion level  PERSONAL FACTORS: Age, Behavior pattern, Fitness, and Time since onset of injury/illness/exacerbation are also affecting patient's functional outcome.   REHAB POTENTIAL: Good  CLINICAL DECISION MAKING: Stable/uncomplicated  EVALUATION COMPLEXITY: Low   GOALS: Goals reviewed with patient? No    SHORT TERM GOALS=LONG TERM GOALS: Target date: 12/17/23  Patient will score at least 75/80 on LEFS to signify clinically meaningful improvement in functional abilities.   Baseline: 69/80; 11/30/23 66/80  Goal status: Ongoing  2.  Patient will increase 30s chair stand reps from 1 to 8 with/without arms to demonstrate and improved functional ability with less pain/difficulty as well as reduce fall risk.  Baseline: 1; 11/30/23 10 reps Goal status: Met  3.  Patient will acknowledge 2/10 pain at least once during episode of care   Baseline: 4/10; 8//6/25 3/10 baseline Goal status: Ongoing  4.  Patient to demonstrate independence in HEP  Baseline: [id number] Goal status: Met    PLAN:  PT FREQUENCY: 1-2x/week  PT DURATION: 6 weeks  PLANNED INTERVENTIONS: 97110-Therapeutic exercises, 97530- Therapeutic activity, [id number]- Neuromuscular re-education, [phone]- Self Care, [id number]- Manual therapy, [phone]- Gait training, Patient/Family education, Balance training, Stair training, Taping, and  Joint mobilization  PLAN FOR NEXT SESSION: HEP review and update, manual techniques as appropriate, aerobic tasks, ROM and flexibility activities, strengthening and PREs, TPDN, gait and balance training as needed     Reyes CHRISTELLA Kohut, PT 11/30/2023, 6:30 PM

## 2023-11-30 ENCOUNTER — Ambulatory Visit: Attending: Podiatry

## 2023-11-30 DIAGNOSIS — S96912A Strain of unspecified muscle and tendon at ankle and foot level, left foot, initial encounter: Secondary | ICD-10-CM | POA: Insufficient documentation

## 2023-11-30 DIAGNOSIS — S93402A Sprain of unspecified ligament of left ankle, initial encounter: Secondary | ICD-10-CM | POA: Insufficient documentation

## 2023-11-30 DIAGNOSIS — M6281 Muscle weakness (generalized): Secondary | ICD-10-CM | POA: Diagnosis present

## 2023-11-30 DIAGNOSIS — R2689 Other abnormalities of gait and mobility: Secondary | ICD-10-CM | POA: Diagnosis present

## 2024-01-30 ENCOUNTER — Ambulatory Visit
Admission: RE | Admit: 2024-01-30 | Discharge: 2024-01-30 | Disposition: A | Source: Ambulatory Visit | Attending: Obstetrics and Gynecology

## 2024-01-30 DIAGNOSIS — Z1231 Encounter for screening mammogram for malignant neoplasm of breast: Secondary | ICD-10-CM
